# Patient Record
Sex: Female | Born: 1990 | Race: Black or African American | Hispanic: No | Marital: Single | Smoking: Former smoker
Health system: Southern US, Community
[De-identification: ages and names within clinical notes are randomized; demographics above are authoritative.]

---
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
## 2012-05-21 ENCOUNTER — Emergency Department (HOSPITAL_BASED_OUTPATIENT_CLINIC_OR_DEPARTMENT_OTHER)
Admission: EM | Admit: 2012-05-21 | Discharge: 2012-05-21 | Disposition: A | Discharge status: Home / Self Care | Payer: Self-pay | Attending: Emergency Medicine | Admitting: Emergency Medicine

## 2012-05-21 ENCOUNTER — Encounter (HOSPITAL_BASED_OUTPATIENT_CLINIC_OR_DEPARTMENT_OTHER): Payer: Self-pay | Admitting: *Deleted

## 2012-05-21 DIAGNOSIS — F17200 Nicotine dependence, unspecified, uncomplicated: Secondary | ICD-10-CM | POA: Insufficient documentation

## 2012-05-21 DIAGNOSIS — K219 Gastro-esophageal reflux disease without esophagitis: Secondary | ICD-10-CM

## 2012-05-21 DIAGNOSIS — R0789 Other chest pain: Secondary | ICD-10-CM | POA: Insufficient documentation

## 2012-05-21 MED ORDER — GI COCKTAIL ~~LOC~~
30.0000 mL | Freq: Once | ORAL | Status: AC
  Administered 2012-05-21: 30 mL via ORAL
  Filled 2012-05-21: qty 30

## 2012-05-21 MED ORDER — OMEPRAZOLE 20 MG PO CPDR: 20.0000 mg | DELAYED_RELEASE_CAPSULE | Freq: Every day | ORAL | Status: DC

## 2012-05-21 NOTE — Discharge Instructions (Signed)
Chest Pain (Nonspecific) Chest pain has many causes. Your pain could be caused by something serious, such as a heart attack or a blood clot in the lungs. It could also be caused by something less serious, such as a chest bruise or a virus. Follow up with your doctor. More lab tests or other studies may be needed to find the cause of your pain. Most of the time, nonspecific chest pain will improve within 2 to 3 days of rest and mild pain medicine. HOME CARE  For chest bruises, you may put ice on the sore area for 15 to 20 minutes, 3 to 4 times a day. Do this only if it makes you or your child feel better.   Put ice in a plastic bag.   Place a towel between the skin and the bag.   Rest for the next 2 to 3 days.   Go back to work if the pain improves.   See your doctor if the pain lasts longer than 1 to 2 weeks.   Only take medicine as told by your doctor.   Quit smoking if you smoke.  GET HELP RIGHT AWAY IF:   There is more pain or pain that spreads to the arm, neck, jaw, back, or belly (abdomen).   You or your child has shortness of breath.   You or your child coughs more than usual or coughs up blood.   You or your child has very bad back or belly pain, feels sick to his or her stomach (nauseous), or throws up (vomits).   You or your child has very bad weakness.   You or your child passes out (faints).   You or your child has a temperature by mouth above 102 F (38.9 C), not controlled by medicine.  Any of these problems may be serious and may be an emergency. Do not wait to see if the problems will go away. Get medical help right away. Call your local emergency services 911 in [place] Do not drive yourself to the hospital. MAKE SURE YOU:   Understand these instructions.   Will watch this condition.   Will get help right away if you or your child is not doing well or gets worse.  Document Released: 05/20/2008 Document Revised: 11/21/2011 Document Reviewed:  05/20/2008 [facility] Patient Information 2012 [place], [place].Gastroesophageal Reflux Disease, Adult Gastroesophageal reflux disease (GERD) happens when acid from your stomach goes into your food pipe (esophagus). The acid can cause a burning feeling in your chest. Over time, the acid can make small holes (ulcers) in your food pipe.  HOME CARE  Ask your doctor for advice about:   Losing weight.   Quitting smoking.   Alcohol use.   Avoid foods and drinks that make your problems worse. You may want to avoid:   Caffeine and alcohol.   Chocolate.   Mints.   Garlic and onions.   Spicy foods.   Citrus fruits, such as oranges, lemons, or limes.   Foods that contain tomato, such as sauce, chili, salsa, and pizza.   Fried and fatty foods.   Avoid lying down for 3 hours before you go to bed or before you take a nap.   Eat small meals often, instead of large meals.   Wear loose-fitting clothing. Do not wear anything tight around your waist.   Raise (elevate) the head of your bed 6 to 8 inches with wood blocks. Using extra pillows does not help.   Only take medicines as  told by your doctor.   Do not take aspirin or ibuprofen.  GET HELP RIGHT AWAY IF:   You have pain in your arms, neck, jaw, teeth, or back.   Your pain gets worse or changes.   You feel sick to your stomach (nauseous), throw up (vomit), or sweat (diaphoresis).   You feel short of breath, or you pass out (faint).   Your throw up is green, yellow, black, or looks like coffee grounds or blood.   Your poop (stool) is red, bloody, or black.  MAKE SURE YOU:   Understand these instructions.   Will watch your condition.   Will get help right away if you are not doing well or get worse.  Document Released: 05/20/2008 Document Revised: 11/21/2011 Document Reviewed: 06/21/2011 [facility] Patient Information 2012 [place], [place].

## 2012-05-21 NOTE — ED Notes (Signed)
Pt c/o upper gastric burning x 2 days

## 2012-05-21 NOTE — ED Provider Notes (Signed)
History     CSN: [id number]  Arrival date & time 05/21/12  1026   First MD Initiated Contact with Patient 05/21/12 1048      Chief Complaint  Patient presents with  . Chest Pain    (Consider location/radiation/quality/duration/timing/severity/associated sxs/prior treatment) Patient is a 21 y.o. female presenting with chest pain. The history is provided by the patient.  Chest Pain The chest pain began 2 days ago. Chest pain occurs intermittently. The chest pain is worsening. Associated with: lying flat. The severity of the pain is moderate. The quality of the pain is described as burning. The pain does not radiate. Chest pain is worsened by certain positions. Pertinent negatives for primary symptoms include no fever, no shortness of breath, no cough and no abdominal pain. She tried nothing for the symptoms. Risk factors include no known risk factors.     History reviewed. No pertinent past medical history.  History reviewed. No pertinent past surgical history.  History reviewed. No pertinent family history.  History  Substance Use Topics  . Smoking status: Current Some Day Smoker -- 0.5 packs/day  . Smokeless tobacco: Not on file  . Alcohol Use: No    OB History    Grav Para Term Preterm Abortions TAB SAB Ect Mult Living                  Review of Systems  Constitutional: Negative for fever.  Respiratory: Negative for cough and shortness of breath.   Cardiovascular: Positive for chest pain.  Gastrointestinal: Negative for abdominal pain.  All other systems reviewed and are negative.    Allergies  Review of patient's allergies indicates no known allergies.  Home Medications  No current outpatient prescriptions on file.  BP 149/90  Pulse 103  Temp(Src) 99.1 F (37.3 C) (Oral)  Resp 16  Ht 5\' 4"  (1.626 m)  Wt 138 lb (62.596 kg)  BMI 23.69 kg/m2  SpO2 100%  LMP 05/14/2012  Physical Exam  Nursing note and vitals reviewed. Constitutional: She is oriented to  person, place, and time. She appears well-developed and well-nourished. No distress.  HENT:  Head: Normocephalic and atraumatic.  Neck: Normal range of motion. Neck supple.  Cardiovascular: Normal rate and regular rhythm.  Exam reveals no gallop and no friction rub.   No murmur heard. Pulmonary/Chest: Effort normal and breath sounds normal. No respiratory distress. She has no wheezes.  Abdominal: Soft. Bowel sounds are normal. She exhibits no distension. There is no tenderness.  Musculoskeletal: Normal range of motion.  Neurological: She is alert and oriented to person, place, and time.  Skin: Skin is warm and dry. She is not diaphoretic.    ED Course  Procedures (including critical care time)  Labs Reviewed - No data to display No results found.   No diagnosis found.   Date: 05/21/2012  Rate: 79  Rhythm: normal sinus rhythm  QRS Axis: normal  Intervals: normal  ST/T Wave abnormalities: normal  Conduction Disutrbances:none  Narrative Interpretation:   Old EKG Reviewed: none available    MDM  The patient presents with symptoms that are most likely GI in nature.  I suspect gerd as the cause.  The symptoms are atypical for cardiac pain and the ekg looks fine.  She was given a gi cocktail which seems to have helped.  I will start her on prilosec twice daily and follow up as needed.  She was also counseled to stop smoking as this could be the cause of her symptoms as  well.          Geoffery Lyons, MD 05/21/12 [id number]

## 2012-08-03 ENCOUNTER — Encounter (HOSPITAL_BASED_OUTPATIENT_CLINIC_OR_DEPARTMENT_OTHER): Payer: Self-pay | Admitting: *Deleted

## 2012-08-03 ENCOUNTER — Emergency Department (HOSPITAL_BASED_OUTPATIENT_CLINIC_OR_DEPARTMENT_OTHER)
Admission: EM | Admit: 2012-08-03 | Discharge: 2012-08-03 | Disposition: A | Discharge status: Home / Self Care | Payer: Self-pay | Attending: Emergency Medicine | Admitting: Emergency Medicine

## 2012-08-03 DIAGNOSIS — N760 Acute vaginitis: Secondary | ICD-10-CM | POA: Insufficient documentation

## 2012-08-03 DIAGNOSIS — F17200 Nicotine dependence, unspecified, uncomplicated: Secondary | ICD-10-CM | POA: Insufficient documentation

## 2012-08-03 LAB — WET PREP, GENITAL: Trich, Wet Prep: NONE SEEN

## 2012-08-03 LAB — URINALYSIS, ROUTINE W REFLEX MICROSCOPIC
Glucose, UA: NEGATIVE mg/dL
Hgb urine dipstick: NEGATIVE
Ketones, ur: NEGATIVE mg/dL
Leukocytes, UA: NEGATIVE
Protein, ur: NEGATIVE mg/dL
pH: 7.5 (ref 5.0–8.0)

## 2012-08-03 MED ORDER — FLUCONAZOLE 50 MG PO TABS
150.0000 mg | ORAL_TABLET | Freq: Once | ORAL | Status: AC
  Administered 2012-08-03: 150 mg via ORAL
  Filled 2012-08-03: qty 1

## 2012-08-03 NOTE — ED Provider Notes (Addendum)
History  This chart was scribed for Gwyneth Sprout, MD by Bennett Scrape. This patient was seen in room MH10/MH10 and the patient's care was started at 3:26PM.  CSN: [id number]  Arrival date & time 08/03/12  1503   First MD Initiated Contact with Patient 08/03/12 1526      Chief Complaint  Patient presents with  . Abdominal Pain     The history is provided by the patient. No language interpreter was used.     Sandra French is a 21 y.o. female who presents to the Emergency Department complaining of one week of gradual onset, gradually worsening, constant lower abdominal pain that radiates into her mid back pain with associated nausea, frequency and thcik white vaginal discharge for 2 days. She reports that occasionally the pain is worse with sexual intercourse but she denies that eating makes the pain worse. She confirms prior episodes of similar symptoms attributed to UTIs. She states that her LNMP was 2 weeks ago but reports that she is on Depro and confirms that her periods are normally irregular. She denies fever, vaginal bleeding, dysuria, rash and emesis as associated symptoms. She denies having a recurrence of yeast infections. She does not have a h/o chronic medical conditions. She is a current everyday smoker but denies alcohol use.  History reviewed. No pertinent past medical history.  History reviewed. No pertinent past surgical history.  No family history on file.  History  Substance Use Topics  . Smoking status: Current Some Day Smoker -- 0.5 packs/day  . Smokeless tobacco: Not on file  . Alcohol Use: No    No OB history provided.  Review of Systems  Respiratory: Negative for cough and shortness of breath.   Gastrointestinal: Positive for nausea. Negative for vomiting and diarrhea.  Genitourinary: Positive for vaginal discharge and pelvic pain. Negative for dysuria, vaginal pain and menstrual problem.  All other systems reviewed and are negative.    A  complete 10 system review of systems was obtained and all systems are negative except as noted in the HPI and PMH.   Allergies  Review of patient's allergies indicates no known allergies.  Home Medications   Current Outpatient Rx  Name Route Sig Dispense Refill  . OMEPRAZOLE 20 MG PO CPDR Oral Take 1 capsule (20 mg total) by mouth daily. 30 capsule 1    Triage Vitals: BP 125/85  Pulse 69  Temp 98.2 F (36.8 C) (Oral)  Resp 18  Wt 148 lb (67.132 kg)  SpO2 100%  Physical Exam  Nursing note and vitals reviewed. Constitutional: She is oriented to person, place, and time. She appears well-developed and well-nourished. No distress.  HENT:  Head: Normocephalic and atraumatic.  Eyes: Conjunctivae and EOM are normal.  Neck: Neck supple. No tracheal deviation present.  Cardiovascular: Normal rate and regular rhythm.   No murmur heard. Pulmonary/Chest: Effort normal and breath sounds normal. No respiratory distress.  Abdominal: Soft. There is no tenderness. There is no rebound and no guarding.       No CVA tenderness  Genitourinary: Uterus normal. Cervix exhibits no motion tenderness, no discharge and no friability. Right adnexum displays no tenderness and no fullness. Left adnexum displays no tenderness and no fullness. No tenderness around the vagina. Vaginal discharge found.       Curd-like vaginal discharge  Musculoskeletal: Normal range of motion. She exhibits no edema.  Neurological: She is alert and oriented to person, place, and time.  Skin: Skin is warm and dry.  Psychiatric: She has a normal mood and affect. Her behavior is normal.    ED Course  Procedures (including critical care time)  DIAGNOSTIC STUDIES: Oxygen Saturation is 100% on room air, normal by my interpretation.    COORDINATION OF CARE: 3:31PM-Discussed treatment plan which includes a pelvic exam and urinalysis with pt at bedside and pt agreed to plan.   Labs Reviewed  URINALYSIS, ROUTINE W REFLEX  MICROSCOPIC - Abnormal; Notable for the following:    APPearance CLOUDY (*)     All other components within normal limits  WET PREP, GENITAL - Abnormal; Notable for the following:    Clue Cells Wet Prep HPF POC FEW (*)     WBC, Wet Prep HPF POC RARE (*)     All other components within normal limits  PREGNANCY, URINE  GC/CHLAMYDIA PROBE AMP, GENITAL   No results found.   1. Vaginitis       MDM   Patient complaining of lower abdominal pain and new vaginal discharge starting 3 days ago. Patient takes Depakote and denies any new sexual partners. Denies any dysuria, fever, vomiting, bowel changes. Low concern for urinary symptoms but feel most likely STD versus yeast infection.  Pelvic with signs of curd-like discharge which is consistent with yeast.  No sx of PID. UA and UPT are within normal limits. Wet prep and GC Chlamydia pending. I personally performed the services described in this documentation, which was scribed in my presence.  The recorded information has been reviewed and considered.  4:42 PM Wet prep unrevealing and will treat with yeast and wait for cultures.     Gwyneth Sprout, MD 08/03/12 1546  Gwyneth Sprout, MD 08/03/12 1550  Gwyneth Sprout, MD 08/03/12 1621  Gwyneth Sprout, MD 08/03/12 1642  Gwyneth Sprout, MD 08/03/12 1644

## 2012-08-03 NOTE — ED Notes (Signed)
Abdominal pain lower back pain x 1 week. Hx of UTI's in the past that give her the same feeling.

## 2012-08-04 LAB — GC/CHLAMYDIA PROBE AMP, GENITAL: Chlamydia, DNA Probe: NEGATIVE

## 2013-01-03 IMAGING — US US TRANSVAGINAL NON-OB
1 series · 14 of 25 positions shown · non-contrast
Comparison: None.

CLINICAL DATA: Pelvic pain, vaginal discharge.  Question TOA.

TRANSABDOMINAL AND TRANSVAGINAL ULTRASOUND OF PELVIS
TECHNIQUE: Both transabdominal and transvaginal ultrasound
examinations of the pelvis were performed including evaluation of
the uterus, ovaries, adnexal regions, and pelvic cul-de-sac.

[Series 1: us transvaginal non-ob · 0.22mm/px · 14 of 89 slices shown]
[im 1/89]
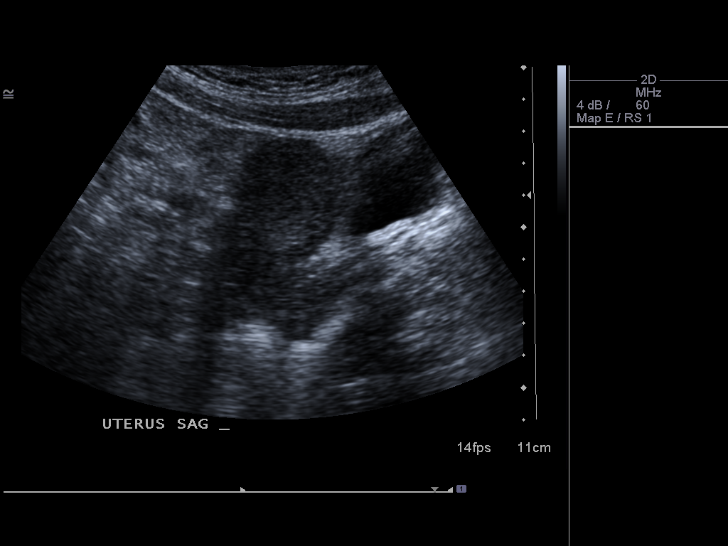
[im 8/89]
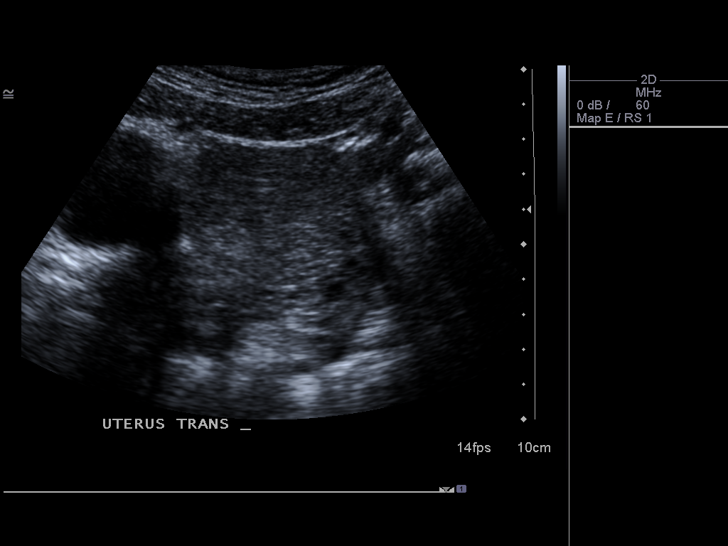
[im 15/89]
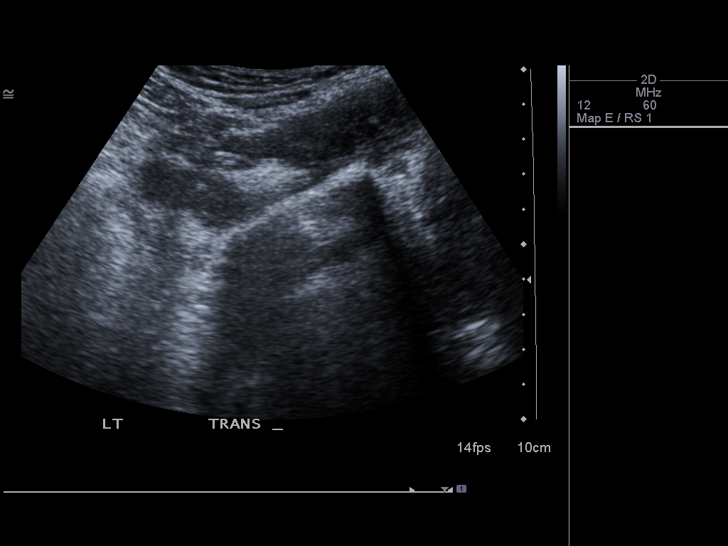
[im 23/89]
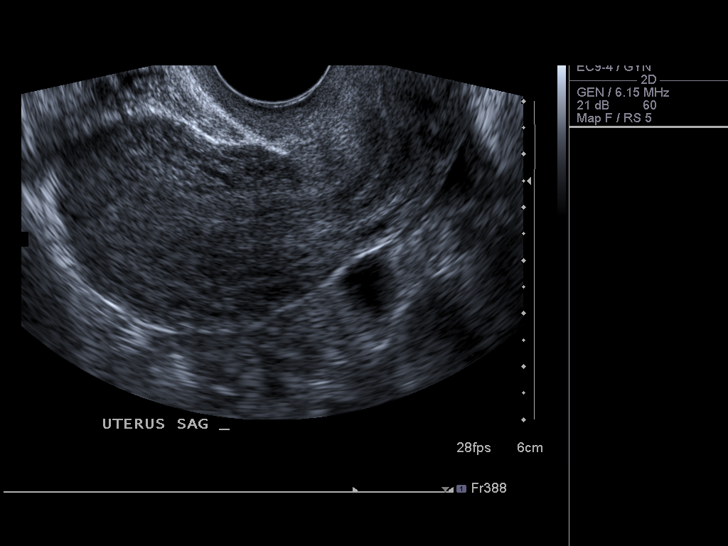
[im 30/89]
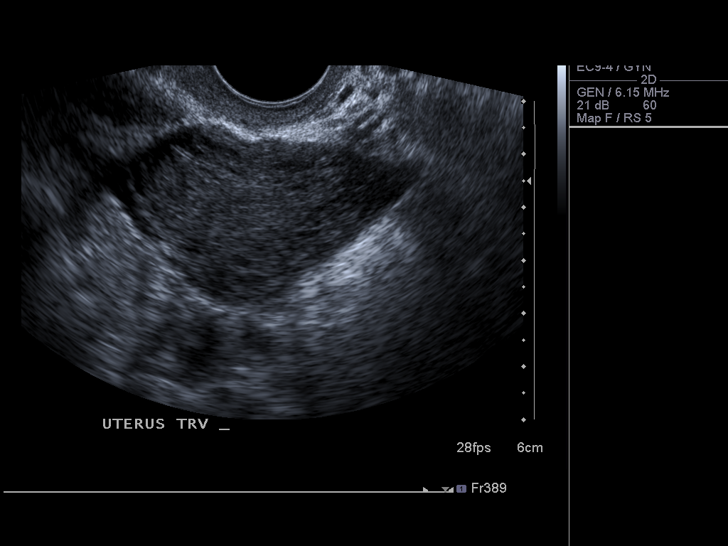
[im 34/89]
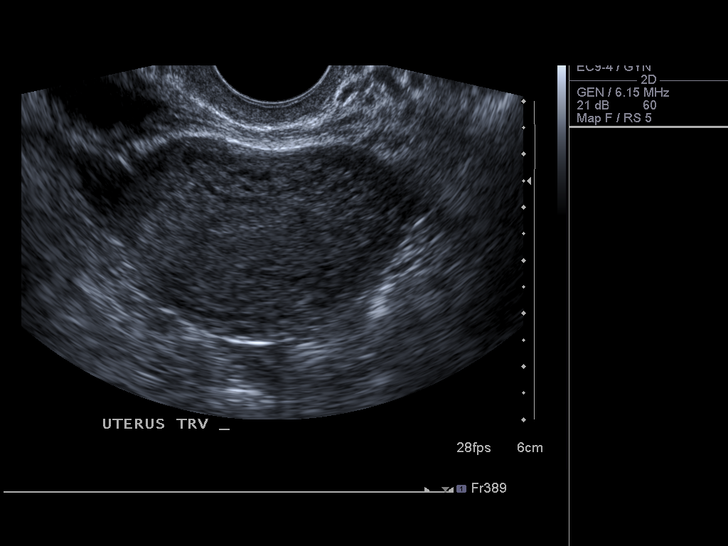
[im 41/89]
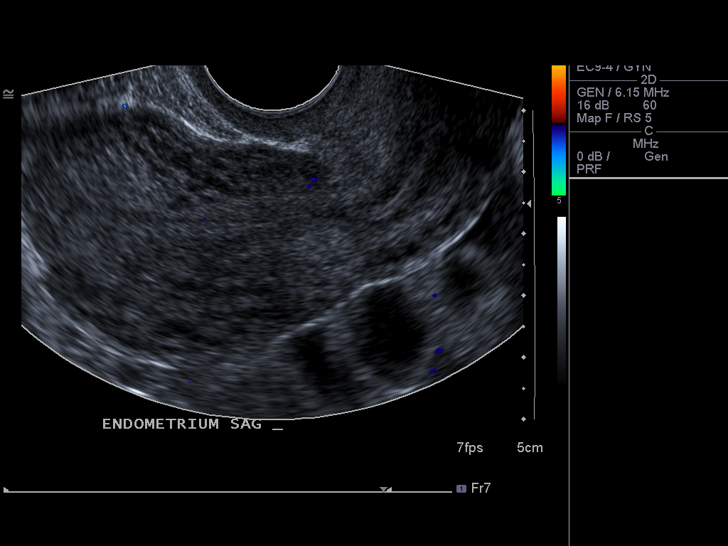
[im 48/89]
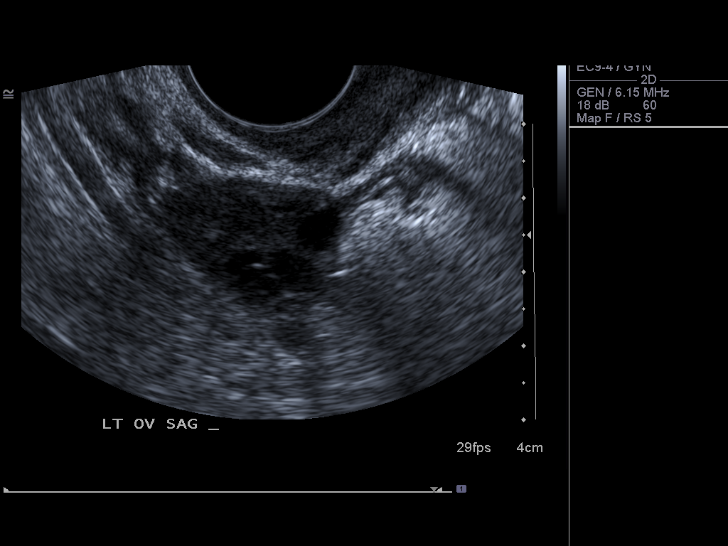
[im 56/89]
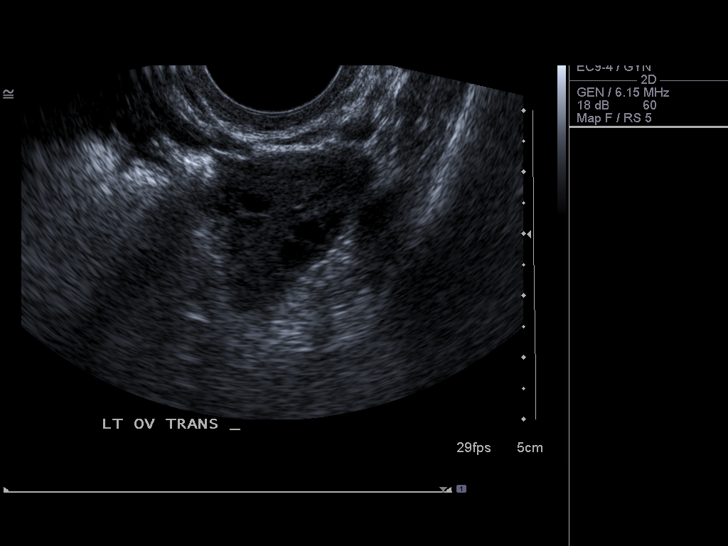
[im 59/89]
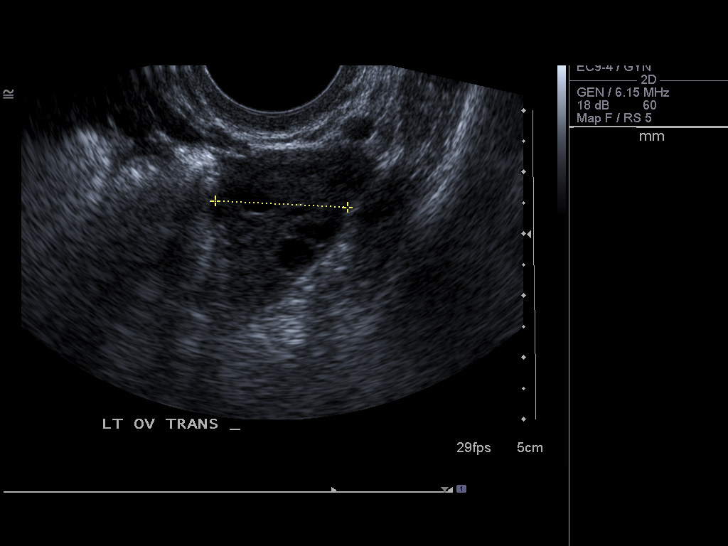
[im 67/89]
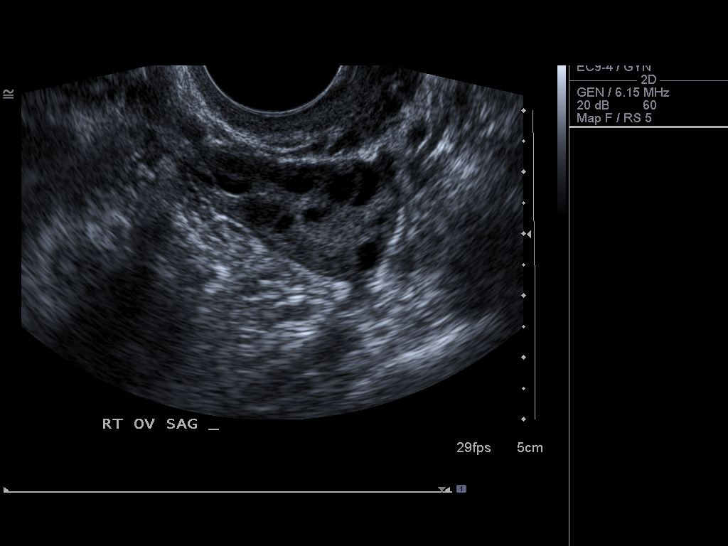
[im 74/89]
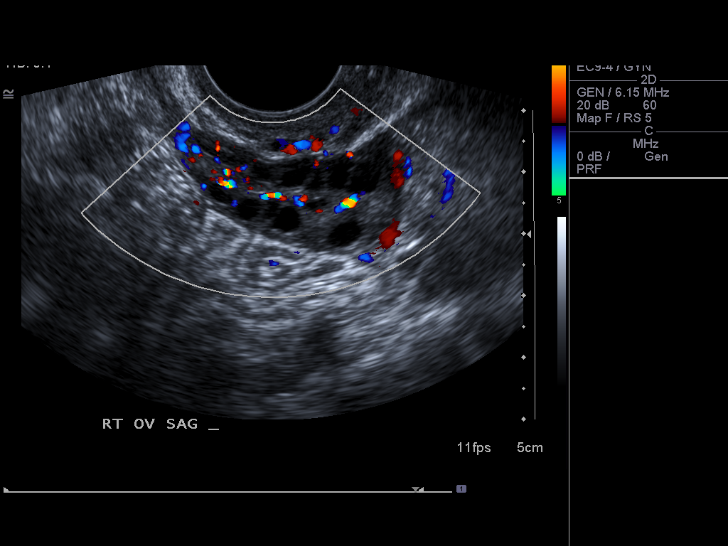
[im 81/89]
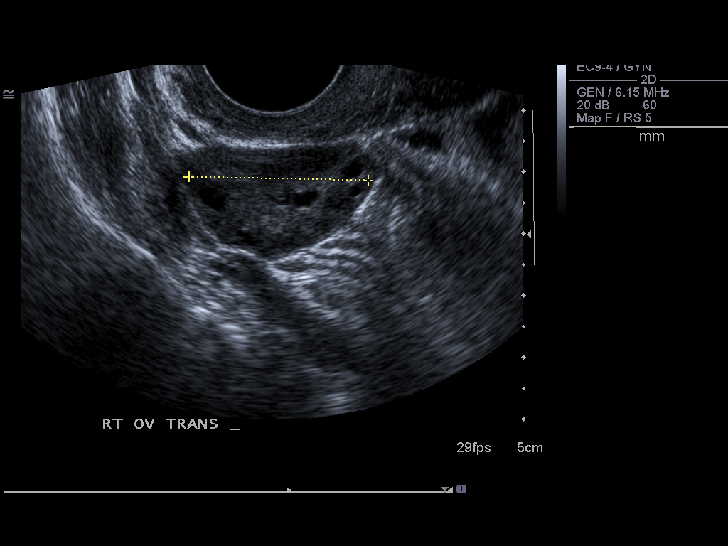
[im 89/89]
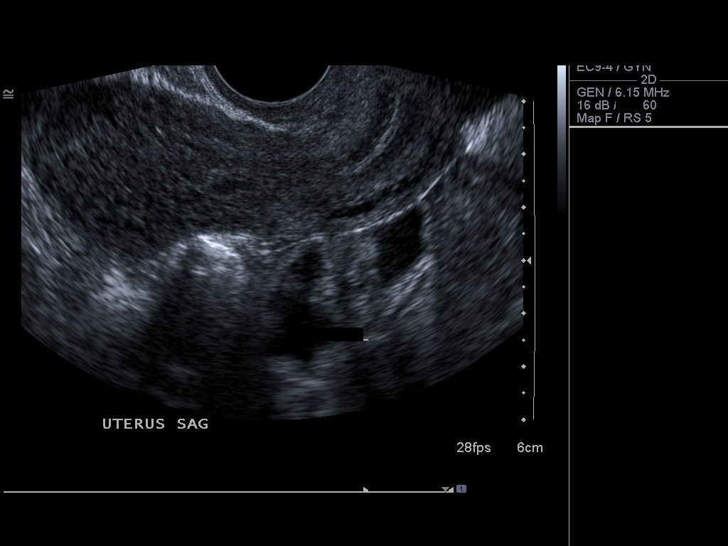

[14 of 25 positions shown; findings below may reference images not displayed]

FINDINGS: Uterus: 5.9 x 3.2 x 5.0 cm.  Normal echotexture.  No focal
abnormality.

Endometrium: Normal appearance and thickness, 3 mm.

Right Ovary: 3.4 x 1.5 x 2.9 cm.  Small follicles. Normal size and
echotexture.  No adnexal masses.

Left Ovary: 3.1 x 2.2 x 2.1 cm.  Small follicles. Normal size and
echotexture.  No adnexal masses.

Other Findings:  No free fluid.
IMPRESSION: Unremarkable pelvic ultrasound.

## 2013-04-02 ENCOUNTER — Encounter (HOSPITAL_BASED_OUTPATIENT_CLINIC_OR_DEPARTMENT_OTHER): Payer: Self-pay

## 2013-04-02 ENCOUNTER — Emergency Department (HOSPITAL_BASED_OUTPATIENT_CLINIC_OR_DEPARTMENT_OTHER): Payer: Medicaid Other

## 2013-04-02 ENCOUNTER — Emergency Department (HOSPITAL_BASED_OUTPATIENT_CLINIC_OR_DEPARTMENT_OTHER)
Admission: EM | Admit: 2013-04-02 | Discharge: 2013-04-02 | Disposition: A | Discharge status: Home / Self Care | Payer: Medicaid Other | Attending: Emergency Medicine | Admitting: Emergency Medicine

## 2013-04-02 DIAGNOSIS — O9989 Other specified diseases and conditions complicating pregnancy, childbirth and the puerperium: Secondary | ICD-10-CM | POA: Insufficient documentation

## 2013-04-02 DIAGNOSIS — Z87891 Personal history of nicotine dependence: Secondary | ICD-10-CM | POA: Insufficient documentation

## 2013-04-02 DIAGNOSIS — R110 Nausea: Secondary | ICD-10-CM | POA: Insufficient documentation

## 2013-04-02 DIAGNOSIS — Z3490 Encounter for supervision of normal pregnancy, unspecified, unspecified trimester: Secondary | ICD-10-CM

## 2013-04-02 DIAGNOSIS — R1084 Generalized abdominal pain: Secondary | ICD-10-CM | POA: Insufficient documentation

## 2013-04-02 LAB — URINALYSIS, ROUTINE W REFLEX MICROSCOPIC
Bilirubin Urine: NEGATIVE
Hgb urine dipstick: NEGATIVE
Ketones, ur: >80 mg/dL — AB
Nitrite: NEGATIVE
pH: 6.5 (ref 5.0–8.0)

## 2013-04-02 NOTE — ED Notes (Signed)
Pt reports she was recently treated for a yeast infection.

## 2013-04-02 NOTE — ED Provider Notes (Signed)
History     CSN: [id number]  Arrival date & time 04/02/13  1341   First MD Initiated Contact with Patient 04/02/13 1524      Chief Complaint  Patient presents with  . Abdominal Pain    (Consider location/radiation/quality/duration/timing/severity/associated sxs/prior treatment) Patient is a 22 y.o. female presenting with abdominal pain. The history is provided by the patient. No language interpreter was used.  Abdominal Pain Pain location:  Generalized Pain quality: aching   Pain severity:  Moderate Timing:  Constant Progression:  Worsening Chronicity:  New Relieved by:  Nothing Worsened by:  Nothing tried Ineffective treatments:  None tried Associated symptoms: nausea   Associated symptoms: no dysuria     History reviewed. No pertinent past medical history.  History reviewed. No pertinent past surgical history.  No family history on file.  History  Substance Use Topics  . Smoking status: Former Smoker -- 0.50 packs/day  . Smokeless tobacco: Not on file  . Alcohol Use: No    OB History   Grav Para Term Preterm Abortions TAB SAB Ect Mult Living   1               Review of Systems  Gastrointestinal: Positive for nausea and abdominal pain.  Genitourinary: Negative for dysuria.  All other systems reviewed and are negative.    Allergies  Review of patient's allergies indicates no known allergies.  Home Medications  No current outpatient prescriptions on file.  BP 103/63  Pulse 90  Temp(Src) 98.5 F (36.9 C) (Oral)  Resp 16  Ht 5\' 6"  (1.676 m)  Wt 153 lb (69.4 kg)  BMI 24.71 kg/m2  SpO2 98%  LMP 11/21/2012  Physical Exam  Nursing note and vitals reviewed. Constitutional: She is oriented to person, place, and time. She appears well-developed and well-nourished.  HENT:  Head: Normocephalic.  Right Ear: External ear normal.  Left Ear: External ear normal.  Nose: Nose normal.  Mouth/Throat: Oropharynx is clear and moist.  Eyes: Conjunctivae and  EOM are normal. Pupils are equal, round, and reactive to light.  Neck: Normal range of motion. Neck supple.  Cardiovascular: Normal rate and normal heart sounds.   Pulmonary/Chest: Effort normal and breath sounds normal.  Abdominal: Soft.  Musculoskeletal: Normal range of motion.  Neurological: She is alert and oriented to person, place, and time.  Skin: Skin is warm and dry.  Psychiatric: She has a normal mood and affect.    ED Course  Procedures (including critical care time)  Labs Reviewed  URINALYSIS, ROUTINE W REFLEX MICROSCOPIC - Abnormal; Notable for the following:    Ketones, ur >80 (*)    All other components within normal limits  PREGNANCY, URINE - Abnormal; Notable for the following:    Preg Test, Ur POSITIVE (*)    All other components within normal limits   US Ob Limited  04/02/2013  *RADIOLOGY REPORT*  Clinical Data: Hit in stomach.  Pelvic pain.  LIMITED OBSTETRIC ULTRASOUND  Number of Fetuses: 1 Heart Rate: 144 bpm Movement: Present Breathing: N/A Presentation: Cephalic Placental Location: Fundal, posterior Previa: Absent Amniotic Fluid (Subjective): Normal  Vertical Pocket 3.9 cm     AFI 13.5 cm  (5%ile = 8.3 cm; 95%ile = 19.4 cm)  BPD:  3.9 cm      17 w  6 d  MATERNAL FINDINGS: Cervix: Cervix closed. Uterus/Adnexae:  No abnormality visualized.  IMPRESSION: Approximately 17-day-18-week gestation with fetal heart rate 144 beats per minute.  No current complicating feature.  Recommend  followup with non-emergent complete OB 14+ wk US examination for fetal biometric evaluation and anatomic survey if not already performed.   Original Report Authenticated By: Charlett Nose, M.D.      1. Pregnancy       MDM   Pt counseled on results.  I advised her to follow up for prenatal care as scheduled       Elson Areas, PA-C 04/05/13 1223

## 2013-04-02 NOTE — ED Notes (Signed)
Pt reports abdominal pain that started this am.  States she is [redacted] weeks pregnant.

## 2013-04-06 NOTE — ED Provider Notes (Signed)
Medical screening examination/treatment/procedure(s) were performed by non-physician practitioner and as supervising physician I was immediately available for consultation/collaboration.   Vincie Linn III, MD 04/06/13 1114 

## 2014-06-14 ENCOUNTER — Encounter (HOSPITAL_BASED_OUTPATIENT_CLINIC_OR_DEPARTMENT_OTHER): Payer: Self-pay | Admitting: Emergency Medicine

## 2014-06-14 ENCOUNTER — Emergency Department (HOSPITAL_BASED_OUTPATIENT_CLINIC_OR_DEPARTMENT_OTHER)
Admission: EM | Admit: 2014-06-14 | Discharge: 2014-06-14 | Disposition: A | Discharge status: Home / Self Care | Payer: Medicaid Other | Attending: Emergency Medicine | Admitting: Emergency Medicine

## 2014-06-14 DIAGNOSIS — Z87891 Personal history of nicotine dependence: Secondary | ICD-10-CM | POA: Insufficient documentation

## 2014-06-14 DIAGNOSIS — K029 Dental caries, unspecified: Secondary | ICD-10-CM | POA: Insufficient documentation

## 2014-06-14 MED ORDER — PENICILLIN V POTASSIUM 250 MG PO TABS
ORAL_TABLET | ORAL | Status: AC
  Administered 2014-06-14: 500 mg
  Filled 2014-06-14: qty 2

## 2014-06-14 MED ORDER — PENICILLIN V POTASSIUM 500 MG PO TABS: 500.0000 mg | ORAL_TABLET | Freq: Four times a day (QID) | ORAL | Status: AC

## 2014-06-14 MED ORDER — HYDROCODONE-ACETAMINOPHEN 5-325 MG PO TABS: 1.0000 | ORAL_TABLET | Freq: Four times a day (QID) | ORAL | Status: DC | PRN

## 2014-06-14 MED ORDER — IBUPROFEN 800 MG PO TABS
ORAL_TABLET | ORAL | Status: AC
  Administered 2014-06-14: 800 mg
  Filled 2014-06-14: qty 1

## 2014-06-14 NOTE — ED Provider Notes (Signed)
CSN: [id number]     Arrival date & time 06/14/14  [id number] History   First MD Initiated Contact with Patient 06/14/14 [phone]     Chief Complaint  Patient presents with  . Abscess     (Consider location/radiation/quality/duration/timing/severity/associated sxs/prior Treatment) Patient is a 23 y.o. female presenting with tooth pain. The history is provided by the patient.  Dental Pain Location:  Lower Lower teeth location:  30/RL 1st molar and 31/RL 2nd molar Quality:  Dull Severity:  Severe Onset quality:  Gradual Timing:  Constant Progression:  Unchanged Chronicity:  New Context: dental fracture and filling fell out   Previous work-up:  Dental exam and filled cavity Relieved by:  Nothing Worsened by:  Nothing tried Ineffective treatments:  None tried Associated symptoms: neck swelling   Associated symptoms: no congestion, no facial swelling and no fever   Risk factors: smoking     History reviewed. No pertinent past medical history. History reviewed. No pertinent past surgical history. History reviewed. No pertinent family history. History  Substance Use Topics  . Smoking status: Former Smoker -- 0.50 packs/day  . Smokeless tobacco: Never Used  . Alcohol Use: No   OB History   Grav Para Term Preterm Abortions TAB SAB Ect Mult Living   1              Review of Systems  Constitutional: Negative for fever.  HENT: Negative for congestion and facial swelling.   All other systems reviewed and are negative.     Allergies  Review of patient's allergies indicates no known allergies.  Home Medications   Prior to Admission medications   Medication Sig Start Date End Date Taking? Authorizing Provider  HYDROcodone-acetaminophen (NORCO) 5-325 MG per tablet Take 1 tablet by mouth every 6 (six) hours as needed. 06/14/14   April K Palumbo-Rasch, MD  penicillin v potassium (VEETID) 500 MG tablet Take 1 tablet (500 mg total) by mouth 4 (four) times daily. 06/14/14 06/21/14  April K  Palumbo-Rasch, MD   BP 133/94  Pulse 80  Temp(Src) 98.8 F (37.1 C) (Oral)  Resp 20  Ht 5\' 6"  (1.676 m)  Wt 159 lb (72.122 kg)  BMI 25.68 kg/m2  SpO2 98%  Breastfeeding? Unknown Physical Exam  Constitutional: She is oriented to person, place, and time. She appears well-developed and well-nourished. No distress.  HENT:  Head: Normocephalic and atraumatic.  Mouth/Throat: Oropharynx is clear and moist. No trismus in the jaw. Dental caries present.    Eyes: Conjunctivae are normal. Pupils are equal, round, and reactive to light.  Neck: Normal range of motion. Neck supple.  Cardiovascular: Normal rate and regular rhythm.   Pulmonary/Chest: Effort normal and breath sounds normal. She has no wheezes. She has no rales.  Abdominal: Soft. Bowel sounds are normal. There is no tenderness. There is no rebound and no guarding.  Musculoskeletal: Normal range of motion.  Lymphadenopathy:    She has no cervical adenopathy.  Neurological: She is alert and oriented to person, place, and time.  Skin: Skin is warm and dry.  Psychiatric: She has a normal mood and affect.    ED Course  Procedures (including critical care time) Labs Review Labs Reviewed - No data to display  Imaging Review No results found.   EKG Interpretation None      MDM   Final diagnoses:  Dental caries    Medications  penicillin v potassium (VEETID) 250 MG tablet (500 mg  Given 06/14/14 0655)  ibuprofen (ADVIL,MOTRIN) 800 MG tablet (  800 mg  Given 06/14/14 [id number])   Will give RX for PCN and pain medication and have given [facility] info and resource guide.  Use barrier protection while taking antibiotics to prevent the risk of pregnancy.  Patient verbalizes understanding and agrees to follow up    April Smitty CordsK Palumbo-Rasch, MD 06/14/14 [phone]

## 2014-06-14 NOTE — ED Notes (Signed)
Pt reports right lower jaw pain r/t dental abscess

## 2014-06-14 NOTE — Discharge Instructions (Signed)
Dental Caries  Dental caries (also called tooth decay) is the most common oral disease. It can occur at any age, but is more common in children and young adults.  HOW DENTAL CARIES DEVELOPS  The process of decay begins when bacteria and foods (particularly sugars and starches) combine in your mouth to produce plaque. Plaque is a substance that sticks to the hard, outer surface of a tooth (enamel). The bacteria in plaque produce acids that attack enamel. These acids may also attack the root surface of a tooth (cementum) if it is exposed. Repeated attacks dissolve these surfaces and create holes in the tooth (cavities). If left untreated, the acids destroy the other layers of the tooth.  RISK FACTORS  Frequent sipping of sugary beverages.   Frequent snacking on sugary and starchy foods, especially those that easily get stuck in the teeth.   Poor oral hygiene.   Dry mouth.   Substance abuse such as methamphetamine abuse.   Broken or poor-fitting dental restorations.   Eating disorders.   Gastroesophageal reflux disease (GERD).   Certain radiation treatments to the head and neck. SYMPTOMS In the early stages of dental caries, symptoms are seldom present. Sometimes white, chalky areas may be seen on the enamel or other tooth layers. In later stages, symptoms may include:  Pits and holes on the enamel.  Toothache after sweet, hot, or cold foods or drinks are consumed.  Pain around the tooth.  Swelling around the tooth. DIAGNOSIS  Most of the time, dental caries is detected during a regular dental checkup. A diagnosis is made after a thorough medical and dental history is taken and the surfaces of your teeth are checked for signs of dental caries. Sometimes special instruments, such as lasers, are used to check for dental caries. Dental X-ray exams may be taken so that areas not visible to the eye (such as between the contact areas of the teeth) can be checked for cavities.    TREATMENT  If dental caries is in its early stages, it may be reversed with a fluoride treatment or an application of a remineralizing agent at the dental office. Thorough brushing and flossing at home is needed to aid these treatments. If it is in its later stages, treatment depends on the location and extent of tooth destruction:   If a small area of the tooth has been destroyed, the destroyed area will be removed and cavities will be filled with a material such as gold, silver amalgam, or composite resin.   If a large area of the tooth has been destroyed, the destroyed area will be removed and a cap (crown) will be fitted over the remaining tooth structure.   If the center part of the tooth (pulp) is affected, a procedure called a root canal will be needed before a filling or crown can be placed.   If most of the tooth has been destroyed, the tooth may need to be pulled (extracted). HOME CARE INSTRUCTIONS You can prevent, stop, or reverse dental caries at home by practicing good oral hygiene. Good oral hygiene includes:  Thoroughly cleaning your teeth at least twice a day with a toothbrush and dental floss.   Using a fluoride toothpaste. A fluoride mouth rinse may also be used if recommended by your dentist or health care provider.   Restricting the amount of sugary and starchy foods and sugary liquids you consume.   Avoiding frequent snacking on these foods and sipping of these liquids.   Keeping regular visits  with a dentist for checkups and cleanings. PREVENTION   Practice good oral hygiene.  Consider a dental sealant. A dental sealant is a coating material that is applied by your dentist to the pits and grooves of teeth. The sealant prevents food from being trapped in them. It may protect the teeth for several years.  Ask about fluoride supplements if you live in a community without fluorinated water or with water that has a low fluoride content. Use fluoride supplements  as directed by your dentist or health care provider.  Allow fluoride varnish applications to teeth if directed by your dentist or health care provider. Document Released: 08/24/2002 Document Revised: 08/04/2013 Document Reviewed: 12/04/2012 [facility] Patient Information 2015 [employer] [place]. This information is not intended to replace advice given to you by your health care provider. Make sure you discuss any questions you have with your health care provider.  Emergency Department Resource Guide 1) Find a Doctor and Pay Out of Pocket Although you won't have to find out who is covered by your insurance plan, it is a good idea to ask around and get recommendations. You will then need to call the office and see if the doctor you have chosen will accept you as a new patient and what types of options they offer for patients who are self-pay. Some doctors offer discounts or will set up payment plans for their patients who do not have insurance, but you will need to ask so you aren't surprised when you get to your appointment.  2) Contact Your Local Health Department Not all health departments have doctors that can see patients for sick visits, but many do, so it is worth a call to see if yours does. If you don't know where your local health department is, you can check in your phone book. The CDC also has a tool to help you locate your state's health department, and many state websites also have listings of all of their local health departments.  3) Find a Walk-in Clinic If your illness is not likely to be very severe or complicated, you may want to try a walk in clinic. These are popping up all over the country in pharmacies, drugstores, and shopping centers. They're usually staffed by nurse practitioners or physician assistants that have been trained to treat common illnesses and complaints. They're usually fairly quick and inexpensive. However, if you have serious medical issues or chronic medical  problems, these are probably not your best option.  No Primary Care Doctor: - Call Health Connect at  [phone] - they can help you locate a primary care doctor that  accepts your insurance, provides certain services, etc. - Physician Referral Service- [phone]  Chronic Pain Problems: Organization         Address  Phone   Notes  [facility]  [phone] Patients need to be referred by their primary care doctor.   Medication Assistance: Organization         Address  Phone   Notes  [facility] [address] [place], [place] [id number] [phone] --Must be a resident of [facility] -- Must have NO insurance coverage whatsoever (no Medicaid/ Medicare, etc.) -- The pt. MUST have a primary care doctor that directs their care regularly and follows them in the community   [facility]  [phone]   [employer]  [phone]    Agencies that provide inexpensive medical care: Organization  Address  Phone   Notes  °[facility]  ([phone]) [phone]   °[facility]    (336) 832-7272   °[facility] [address] °[place], [place] [address] (336) 832-4777   °[facility] [address], °[place] (336) 271-4999   °[facility]    (336) 373-0678   °[facility]    (336) 272-1050   °[facility] ° [address], [place] Phone:  (336) [phone], Fax:  (336) [phone] Hours of Operation:  9 am - 6 pm, M-F.  Also accepts Medicaid/Medicare and self-pay.  °[facility] ° [address], [place] Phone: (336) [phone], Fax: ([phone]) [phone]. Hours of Operation:  8:30 am - 5:30 pm, M-F.  Also accepts Medicaid and self-pay.  °[facility] [place] [address], [place] Phone: (336) [phone]   °[facility] [address], [place], [place] (336)723-1848, Ext. 123  Mondays & Thursdays: 7-9 AM.  First 15 patients are seen on a first come, first serve basis. °  ° °Medicaid-accepting [place] Providers: ° °Organization         Address  Phone   Notes  °[facility] [address], [place] ([phone]) [phone] Also accepts self-pay patients.  °[facility] [address], [place] ° (336) [phone]   °[facility] [address], [place] (336) 288-8857   °[facility] [address] [place] [address], [place] (336) 299-7000   °Veita Bland [address], [place]  ° (336) [phone] Only accepts [place] Access Medicaid patients after they have their name applied to their card.  ° °Self-Pay (no insurance) in [place]: ° °Organization         Address  Phone   Notes  °Sickle Cell Patients, [facility] [address], [place] (336) 832-1970   °[facility] [address], [place] (336) 832-4400   ° [facility] ° [address], [place] (336) 992-4800   °[facility]/Dr. Osei-Bonsu ° [address] [place] [address], [place] or [address], [place] (336) [phone] Phone number for both [place] and [place] locations is the same.  °[facility] [address], [place] (336) [phone]   °[facility] [address] [place] [address], [place] or [address] (336) 852-7530 °(336) 878-2260   °[facility] [address], [place] (336) 350-1642, phone; (336) [phone], fax Sees patients 1st and 3rd Saturday of every month.  Must not qualify for public or private insurance (i.e. Medicaid, Medicare, [place] Health Choice, Veterans' Benefits) • Household income should be no more than 200% of the poverty level •The clinic cannot treat you if you are pregnant or think you are pregnant • Sexually transmitted diseases are not treated at  the clinic.  ° ° °Dental Care: °Organization         Address  Phone  Notes  °[facility] [address], [place] (336) 641-6152 Accepts children up to age 21 who are enrolled in Medicaid or [place] Health Choice; pregnant women with a Medicaid card; and children who have applied for Medicaid or [place] Health Choice, but were declined, whose parents can pay a reduced fee at time   of service.  °[facility] [place]  [address], [place] (336) 641-7733 Accepts children up to age 21 who are enrolled in Medicaid or [place] Health Choice; pregnant women with a Medicaid card; and children who have applied for Medicaid or [place] Health Choice, but were declined, whose parents can pay a reduced fee at time of service.  °[facility] ° [address], [place] (336) 641-4533 Patients are seen by appointment only. Walk-ins are not accepted. [facility] will see patients 18 years of age and older. °Monday - Tuesday (8am-5pm) °Most Wednesdays (8:30-5pm) °$30 per visit, cash only  °[facility] ° [address], [place] (336) [phone] Patients are seen by appointment only. Walk-ins are not accepted. [facility] will see patients 18 years of age and older. °One Wednesday Evening (Monthly: Volunteer Based).  $30 per visit, cash only  °[facility]  ([phone]) [phone] for adults; Children under age 4, call Graduate Pediatric Dentistry at ([phone]) [phone]. Children aged 4-14, please call [phone]) [phone] to request a pediatric application. ° Dental services are provided in all areas of dental care including fillings, crowns and bridges, complete and partial dentures, implants, gum treatment, root canals, and extractions. Preventive care is also provided. Treatment is provided to both adults and children. °Patients are selected via a lottery and there is often a  waiting list. °  °[facility] [address], °[place] ° (336) 763-8833 [email or web address] °  °[facility] [address], [place], [place] (336)723-1848, Ext. 123 Second and Fourth Thursday of each month, opens at 6:30 AM; Clinic ends at 9 AM.  Patients are seen on a first-come first-served basis, and a limited number are seen during each clinic.  ° °[facility] ° [address], [place], [place] (336) 723-7904   Eligibility Requirements °You must have lived in [place], [place], or [place] counties for at least the last three months. °  You cannot be eligible for state or federal sponsored healthcare insurance, including Veterans Administration, Medicaid, or Medicare. °  You generally cannot be eligible for healthcare insurance through your employer.  °  How to apply: °Eligibility screenings are held every Tuesday and Wednesday afternoon from 1:00 pm until 4:00 pm. You do not need an appointment for the interview!  °[facility] [address], [place], [place] 336-631-2330   °[facility]  336-342-8273   °[facility]  336-703-3100   °[facility]  336-570-6415   ° °Behavioral Health Resources in the Community: °Intensive Outpatient Programs °Organization         Address  Phone  Notes  °[facility] [address], [place], [place] 336-878-6098   °[facility] [address], [place], [place] 336-832-9800   °[facility] [address], [place], [place] ° 336-882-2125   °[facility] [address],  °[place], [place] 1-800-853-5163 or 336-641-4981   °Substance Abuse Resources °Organization         Address  Phone  Notes  °[facility]  336-882-2125   °[facility]  336-784-9470   °[facility]  336-285-9073   °[facility]  336-845-3988   °Residential & Outpatient Substance Abuse  Program  1-800-659-3381   °Psychological Services °Organization         Address    Phone  Notes  °[facility]  336- 832-9600   °[facility]  336- 378-7881   °[facility] [address], [place] 1-800-853-5163 or 336-641-4981   ° °Mobile Crisis Teams °Organization         Address  Phone  Notes  °[facility], Mobile Crisis Care Unit  1-877-626-1772   °Assertive °[facility] ° [address] [place], [place] 336-834-9664   °Sharon DeEsch [address] °[place] 336-554-5454   ° °Self-Help/Support Groups °Organization         Address  Phone             Notes  °[facility] - variety of support groups  336- 373-1402 Call for more information  °Narcotics Anonymous (NA), [facility] [address], °[place]  2 meetings at this location  ° °Residential Treatment Programs °Organization         Address  Phone  Notes  °[facility] [address],    °[place]  1-866-801-8205   °[facility] ° [address], [place], [place] 704-293-8524   °[facility] [address], [place] 336-845-3988 Admissions: 8am-3pm M-F  °[facility] [address]    °[place], [place] 336-841-1104   °[facility] [address], [place], [place] 336-379-7146   °[facility] [address]  °[place], [place] 336-285-9073   °[facility] - Intensive Outpatient [address], [place], [place] 336-852-3033   °[facility] [address]  °[place], [place] 1-877-615-2722 or 336-784-9470   °[facility] [address] [place], [place] 336-227-7417 Accepts Medicaid  °[facility] [address]  ° [place] 1-800-659-3381 Substance Abuse/Addiction Treatment  ° °[place] Behavioral Health Resources °Organization          Address  Phone  Notes  °[facility]  (888) 581-9988   °Julie Brannon, PhD [address] [place], [place]   (336) 349-5553 or (336) 951-0000   °[facility]   [address] °[place], [place] (336) 349-4454   °[facility] [address], [place], [place] (336) 342-8316 Insurance/Medicaid/sponsorship through [facility]  °[facility] [address]                                    [place], [place] (336) 342-8316 Therapy/tele-psych/case  °[facility] [address]  ° [place], [place] (336) 349-2233    °Dr. Arfeen  (336) 349-4544   °[facility] [place]  [employer] [place] [facility] 1) [address], [place] °2) [address], [place] °3)  [address], [place] (336) 349-3220 °(336) 342-7768 ° °(336) 342-8140   °[place] [facility] (336) 342-1394 or (336) 342-3537 (After Hours)    ° ° °

## 2014-07-04 ENCOUNTER — Emergency Department (HOSPITAL_BASED_OUTPATIENT_CLINIC_OR_DEPARTMENT_OTHER): Payer: Medicaid Other

## 2014-07-04 ENCOUNTER — Encounter (HOSPITAL_BASED_OUTPATIENT_CLINIC_OR_DEPARTMENT_OTHER): Payer: Self-pay | Admitting: Emergency Medicine

## 2014-07-04 ENCOUNTER — Emergency Department (HOSPITAL_BASED_OUTPATIENT_CLINIC_OR_DEPARTMENT_OTHER)
Admission: EM | Admit: 2014-07-04 | Discharge: 2014-07-04 | Disposition: A | Discharge status: Home / Self Care | Payer: Self-pay | Attending: Emergency Medicine | Admitting: Emergency Medicine

## 2014-07-04 DIAGNOSIS — Z79899 Other long term (current) drug therapy: Secondary | ICD-10-CM | POA: Insufficient documentation

## 2014-07-04 DIAGNOSIS — Z87891 Personal history of nicotine dependence: Secondary | ICD-10-CM | POA: Insufficient documentation

## 2014-07-04 DIAGNOSIS — N925 Other specified irregular menstruation: Secondary | ICD-10-CM | POA: Insufficient documentation

## 2014-07-04 DIAGNOSIS — N949 Unspecified condition associated with female genital organs and menstrual cycle: Secondary | ICD-10-CM | POA: Insufficient documentation

## 2014-07-04 DIAGNOSIS — N938 Other specified abnormal uterine and vaginal bleeding: Secondary | ICD-10-CM | POA: Insufficient documentation

## 2014-07-04 DIAGNOSIS — Z3202 Encounter for pregnancy test, result negative: Secondary | ICD-10-CM | POA: Insufficient documentation

## 2014-07-04 DIAGNOSIS — R109 Unspecified abdominal pain: Secondary | ICD-10-CM | POA: Insufficient documentation

## 2014-07-04 DIAGNOSIS — R102 Pelvic and perineal pain: Secondary | ICD-10-CM

## 2014-07-04 DIAGNOSIS — K0889 Other specified disorders of teeth and supporting structures: Secondary | ICD-10-CM

## 2014-07-04 DIAGNOSIS — R1031 Right lower quadrant pain: Secondary | ICD-10-CM | POA: Insufficient documentation

## 2014-07-04 DIAGNOSIS — N939 Abnormal uterine and vaginal bleeding, unspecified: Secondary | ICD-10-CM

## 2014-07-04 DIAGNOSIS — K089 Disorder of teeth and supporting structures, unspecified: Secondary | ICD-10-CM | POA: Insufficient documentation

## 2014-07-04 DIAGNOSIS — Z880 Allergy status to penicillin: Secondary | ICD-10-CM | POA: Insufficient documentation

## 2014-07-04 LAB — URINE MICROSCOPIC-ADD ON

## 2014-07-04 LAB — BASIC METABOLIC PANEL
ANION GAP: 10 (ref 5–15)
BUN: 12 mg/dL (ref 6–23)
CALCIUM: 9.1 mg/dL (ref 8.4–10.5)
CO2: 26 mEq/L (ref 19–32)
Chloride: 107 mEq/L (ref 96–112)
Creatinine, Ser: 0.9 mg/dL (ref 0.50–1.10)
GFR calc Af Amer: >90 mL/min (ref >90)
GLUCOSE: 98 mg/dL (ref 70–99)
Potassium: 3.9 mEq/L (ref 3.7–5.3)
SODIUM: 143 meq/L (ref 137–147)

## 2014-07-04 LAB — CBC WITH DIFFERENTIAL/PLATELET
BASOS ABS: 0 10*3/uL (ref 0.0–0.1)
BASOS PCT: 0 % (ref 0–1)
EOS ABS: 0.2 10*3/uL (ref 0.0–0.7)
EOS PCT: 2 % (ref 0–5)
HCT: 35.8 % — ABNORMAL LOW (ref 36.0–46.0)
Hemoglobin: 12.1 g/dL (ref 12.0–15.0)
Lymphocytes Relative: 39 % (ref 12–46)
Lymphs Abs: 2.8 10*3/uL (ref 0.7–4.0)
MCH: 29.8 pg (ref 26.0–34.0)
MCHC: 33.8 g/dL (ref 30.0–36.0)
MCV: 88.2 fL (ref 78.0–100.0)
Monocytes Absolute: 0.5 10*3/uL (ref 0.1–1.0)
Monocytes Relative: 7 % (ref 3–12)
Neutro Abs: 3.7 10*3/uL (ref 1.7–7.7)
Neutrophils Relative %: 52 % (ref 43–77)
PLATELETS: 199 10*3/uL (ref 150–400)
RBC: 4.06 MIL/uL (ref 3.87–5.11)
RDW: 13.4 % (ref 11.5–15.5)
WBC: 7.2 10*3/uL (ref 4.0–10.5)

## 2014-07-04 LAB — URINALYSIS, ROUTINE W REFLEX MICROSCOPIC
BILIRUBIN URINE: NEGATIVE
Glucose, UA: NEGATIVE mg/dL
KETONES UR: 15 mg/dL — AB
NITRITE: NEGATIVE
Protein, ur: NEGATIVE mg/dL
SPECIFIC GRAVITY, URINE: 1.029 (ref 1.005–1.030)
UROBILINOGEN UA: 1 mg/dL (ref 0.0–1.0)
pH: 6.5 (ref 5.0–8.0)

## 2014-07-04 LAB — WET PREP, GENITAL
Clue Cells Wet Prep HPF POC: NONE SEEN
Trich, Wet Prep: NONE SEEN
YEAST WET PREP: NONE SEEN

## 2014-07-04 LAB — PREGNANCY, URINE: Preg Test, Ur: NEGATIVE

## 2014-07-04 MED ORDER — ONDANSETRON HCL 4 MG/2ML IJ SOLN
4.0000 mg | Freq: Once | INTRAMUSCULAR | Status: AC
  Administered 2014-07-04: 4 mg via INTRAVENOUS
  Filled 2014-07-04: qty 2

## 2014-07-04 MED ORDER — MORPHINE SULFATE 4 MG/ML IJ SOLN
4.0000 mg | Freq: Once | INTRAMUSCULAR | Status: AC
  Administered 2014-07-04: 4 mg via INTRAVENOUS
  Filled 2014-07-04: qty 1

## 2014-07-04 MED ORDER — SODIUM CHLORIDE 0.9 % IV SOLN
Freq: Once | INTRAVENOUS | Status: AC
Start: 2014-07-04 — End: 2014-07-04
  Administered 2014-07-04: 20:00:00 via INTRAVENOUS

## 2014-07-04 MED ORDER — HYDROCODONE-ACETAMINOPHEN 5-325 MG PO TABS: 1.0000 | ORAL_TABLET | ORAL | Status: DC | PRN

## 2014-07-04 NOTE — ED Provider Notes (Signed)
Medical screening examination/treatment/procedure(s) were performed by non-physician practitioner and as supervising physician I was immediately available for consultation/collaboration.   EKG Interpretation None       Martha K Linker, MD 07/04/14 2256 

## 2014-07-04 NOTE — Discharge Instructions (Signed)
Read the information below.  Use the prescribed medication as directed.  Please discuss all new medications with your pharmacist.  Do not take additional tylenol while taking the prescribed pain medication to avoid overdose.  You may return to the Emergency Department at any time for worsening condition or any new symptoms that concern you.    If you develop high fevers, worsening abdominal pain, uncontrolled vomiting, or are unable to tolerate fluids by mouth, return to the ER for a recheck.    Please call the oral surgeon listed above to schedule a close follow up appointment.  If you develop fevers, swelling in your face, difficulty swallowing or breathing, return to the ER immediately for a recheck.     Abdominal Pain Many things can cause abdominal pain. Usually, abdominal pain is not caused by a disease and will improve without treatment. It can often be observed and treated at home. Your health care provider will do a physical exam and possibly order blood tests and X-rays to help determine the seriousness of your pain. However, in many cases, more time must pass before a clear cause of the pain can be found. Before that point, your health care provider may not know if you need more testing or further treatment. HOME CARE INSTRUCTIONS  Monitor your abdominal pain for any changes. The following actions may help to alleviate any discomfort you are experiencing:  Only take over-the-counter or prescription medicines as directed by your health care provider.  Do not take laxatives unless directed to do so by your health care provider.  Try a clear liquid diet (broth, tea, or water) as directed by your health care provider. Slowly move to a bland diet as tolerated. SEEK MEDICAL CARE IF:  You have unexplained abdominal pain.  You have abdominal pain associated with nausea or diarrhea.  You have pain when you urinate or have a bowel movement.  You experience abdominal pain that wakes you in  the night.  You have abdominal pain that is worsened or improved by eating food.  You have abdominal pain that is worsened with eating fatty foods.  You have a fever. SEEK IMMEDIATE MEDICAL CARE IF:   Your pain does not go away within 2 hours.  You keep throwing up (vomiting).  Your pain is felt only in portions of the abdomen, such as the right side or the left lower portion of the abdomen.  You pass bloody or black tarry stools. MAKE SURE YOU:  Understand these instructions.   Will watch your condition.   Will get help right away if you are not doing well or get worse.  Document Released: 09/11/2005 Document Revised: 12/07/2013 Document Reviewed: 08/11/2013 [facility] Patient Information 2015 [place], [place]. This information is not intended to replace advice given to you by your health care provider. Make sure you discuss any questions you have with your health care provider.  Abnormal Uterine Bleeding Abnormal uterine bleeding can affect women at various stages in life, including teenagers, women in their reproductive years, pregnant women, and women who have reached menopause. Several kinds of uterine bleeding are considered abnormal, including:  Bleeding or spotting between periods.   Bleeding after sexual intercourse.   Bleeding that is heavier or more than normal.   Periods that last longer than usual.  Bleeding after menopause.  Many cases of abnormal uterine bleeding are minor and simple to treat, while others are more serious. Any type of abnormal bleeding should be evaluated by your health care provider.  Treatment will depend on the cause of the bleeding. HOME CARE INSTRUCTIONS Monitor your condition for any changes. The following actions may help to alleviate any discomfort you are experiencing:  Avoid the use of tampons and douches as directed by your health care provider.  Change your pads frequently. You should get regular pelvic exams and Pap tests.  Keep all follow-up appointments for diagnostic tests as directed by your health care provider.  SEEK MEDICAL CARE IF:   Your bleeding lasts more than 1 week.   You feel dizzy at times.  SEEK IMMEDIATE MEDICAL CARE IF:   You pass out.   You are changing pads every 15 to 30 minutes.   You have abdominal pain.  You have a fever.   You become sweaty or weak.   You are passing large blood clots from the vagina.   You start to feel nauseous and vomit. MAKE SURE YOU:   Understand these instructions.  Will watch your condition.  Will get help right away if you are not doing well or get worse. Document Released: 12/02/2005 Document Revised: 12/07/2013 Document Reviewed: 07/01/2013 [facility] Patient Information 2015 [place], [place]. This information is not intended to replace advice given to you by your health care provider. Make sure you discuss any questions you have with your health care provider.   Emergency Department Resource Guide 1) Find a Doctor and Pay Out of Pocket Although you won't have to find out who is covered by your insurance plan, it is a good idea to ask around and get recommendations. You will then need to call the office and see if the doctor you have chosen will accept you as a new patient and what types of options they offer for patients who are self-pay. Some doctors offer discounts or will set up payment plans for their patients who do not have insurance, but you will need to ask so you aren't surprised when you get to your appointment.  2) Contact Your Local Health Department Not all health departments have doctors that can see patients for sick visits, but many do, so it is worth a call to see if yours does. If you don't know where your local health department is, you can check in your phone book. The CDC also has a tool to help you locate your state's health department, and many state websites also have listings of all of their local health  departments.  3) Find a Walk-in Clinic If your illness is not likely to be very severe or complicated, you may want to try a walk in clinic. These are popping up all over the country in pharmacies, drugstores, and shopping centers. They're usually staffed by nurse practitioners or physician assistants that have been trained to treat common illnesses and complaints. They're usually fairly quick and inexpensive. However, if you have serious medical issues or chronic medical problems, these are probably not your best option.  No Primary Care Doctor: - Call Health Connect at  [phone] - they can help you locate a primary care doctor that  accepts your insurance, provides certain services, etc. - Physician Referral Service- [phone]  Chronic Pain Problems: Organization         Address  Phone   Notes  [facility]  [phone] Patients need to be referred by their primary care doctor.   Medication Assistance: Organization         Address  Phone   Notes  [facility] [phone]  [address] [place], [place] [address] [phone] --Must be a resident of [facility] -- Must have NO insurance coverage whatsoever (no Medicaid/ Medicare, etc.) -- The pt. MUST have a primary care doctor that directs their care regularly and follows them in the community   [facility]ist  [phone]   [employer]  [phone]    Agencies that provide inexpensive medical care: Organization         Address  Phone   Notes  [facility]  [phone]   [facility]    [phone]   [facility] [address] [place], [place] [address] [phone]   [facility] [address] [place] [phone]   Planned Parenthood    [phone]   [facility]    [phone]   [facility]  [address], [place] Phone:  [phone], Fax:  [phone] Hours of Operation:  9 am - 6 pm, M-F.  Also accepts Medicaid/Medicare and self-pay.  [facility]  [address], [place] Phone: [phone], Fax: [phone]. Hours of Operation:  8:30 am - 5:30 pm, M-F.  Also accepts Medicaid and self-pay.  [facility] [place] [address], [place] Phone: [phone]   [facility] [address] Natasha Bence [place], [place] [phone], Ext. [phone] Mondays & Thursdays: 7-9 AM.  First 15 patients are seen on a first come, first serve basis.    Medicaid-accepting [facility] Providers:  Organization         Address  Phone   Notes  [facility] [address], [place] [phone] Also accepts self-pay patients.  [facility] [address] Laurell Josephs [place], [place]  [phone]   [facility] [address], [place] [phone]   [facility] [address] [place] [phone]   Renaye Rakers [address], [place]   [phone] Only accepts [place] patients after they have their name applied to their card.   Self-Pay (no insurance) in [facility]:  Organization         Address  Phone   Notes  Sickle Cell Patients, [facility] [address] [place], [place] [phone]   [facility] [address] [place], [place] [phone]   [facility] [place]  [address], [place] [phone]   [facility]/Dr. Osei-Bonsu  [address] [place] or [address], [place] [phone] Phone number for both [place] and [place] locations is the same.  [facility] [address] [place] [phone]   [facility] [address] [place] or [address] [phone]   [facility] [address], [place] [phone], phone; [phone], fax Sees patients 1st and 3rd Saturday of every month.  Must not qualify for public or private insurance (i.e. Medicaid, Medicare, [place] Health Choice, Veterans' Benefits)  Household income should be no more than 200% of the poverty level The clinic cannot treat you if you are pregnant or think you are pregnant  Sexually transmitted diseases are not treated at the clinic.  Dental Care: Organization         Address  Phone  Notes  [facility] [address], [place] [phone]) [phone] Accepts children up to age 21 who are enrolled in [place]icaid or [place] Health Choice; pregnant women with a Medicaid card; and children who have applied for Medicaid or [place] Health Choice, but were declined, whose parents can pay a reduced fee at time of service.  [facility] [place]  [address], [place] [phone]) [phone] Accepts children up to age 21 who are enrolled in [place] or [place] Health Choice; pregnant women with a Medicaid card; and children who have applied for Medicaid or [place] Health Choice, but were declined, whose parents can pay a reduced fee at time of service.  [facility]  [address], [place] [phone] Patients are seen by appointment only. Walk-ins are not accepted. [facility] will see patients 21 years of age and older. Monday - Tuesday (8am-5pm) Most Wednesdays (8:30-5pm) $30 per visit, cash only  [facility]  [address], [facility] [phone] Patients are seen by appointment only. Walk-ins are not accepted. [facility] will see patients 21 years of age and older. One Wednesday Evening (Monthly: Volunteer Based).  $30 per visit, cash only  [employer] [facility]  [phone] for adults;  Children under age 644, call Graduate Pediatric Dentistry at [phone]. Children aged 744-14, please call [phone] to request a pediatric application.  Dental services are provided in all areas of dental care including fillings, crowns and bridges, complete and partial dentures, implants, gum treatment, root canals, and extractions. Preventive care is also provided. Treatment is provided to both adults and children. Patients are selected via a lottery and there is often a waiting list.   [facility] [address], [place]  [phone]) [phone] [email or web address]   [facility] [address], [place], [place] [phone], Ext. [phone] Second and Fourth Thursday of each month, opens at 6:30 AM; Clinic ends at 9 AM.  Patients are seen on a first-come first-served basis, and a limited number are seen during each clinic.   [facility]  [address], [place], [place] [phone]) [phone]   Eligibility Requirements You must have lived in [place], [place], or [place] counties for at least the last three months.   You cannot be eligible for state or federal sponsored [place]healthcare insurance, including CIGNAVeterans Administration, [place]icaid, or [employer]icare.   You generally cannot be eligible for healthcare insurance through your employer.    How to apply: Eligibility screenings are held every Tuesday and Wednesday afternoon from 1:00 pm until 4:00 pm. You do not need an appointment for the interview!  [facility] [address], [place], [place] [phone]   [facility]  [phone]   [facility]  [phone]   [facility]  [phone]    Behavioral Health Resources in the Community: Intensive Outpatient Programs Organization         Address  Phone  Notes  [facility] [address], [place], [place] [phone]   [facility] [address], [place], [place] [phone]   [facility] [address], [place], [place]  [phone]   [facility] [address],  [place], [place] [phone] or [phone]   Substance Abuse Resources Organization         Address  Phone  Notes  [facility]  [phone]   [facility]  [phone]   [facility]  [phone]   [facility]  [phone]   Residential & Outpatient Substance Abuse Program  [phone]   Psychological Services Organization         Address  Phone  Notes  [facility]  [phone]- [phone]   [facility]  [phone]- [phone]   [facility] [address], [place] [phone] or [phone]    Mobile Crisis Teams Organization         Address  Phone  Notes  [facility], Mobile Crisis Care Unit  [phone]   [facility]  [address] [place], [place] [phone]   Doristine LocksSharon DeEsch [address] [place] [phone]    Self-Help/Support Groups Organization         Address  Phone             Notes  [facility] [place] - variety of support groups  [phone] Call for more information  Narcotics Anonymous (NA), [facility]aring [facility] [address], [place]  2 meetings at this location   Statisticianesidential Treatment Programs Organization         Address  Phone  Notes  [facility] [address],    [place]  [phone]   [facility]  [address], [place] [phone], [place], [place] [phone]   [facility] [address], [place] [phone] Admissions: 8am-3pm M-F  [facility] [address]    [place], [place] [phone]   [facility] [address], [place], [place] [phone]   [facility] [address]  [place], [place] [phone]   [facility] - Intensive  Outpatient [address], [place], [place] [phone]   [facility] ([facility] [address]  [place], [place] [phone] or [phone]   [facility] (RT[facility]) [address] [place], [place] [phone] Accepts Medicaid  [facility] [address]  [place] [phone] Substance Abuse/Addiction Treatment   [facility] Resources Organization         Address  Phone  Notes  [facility]  [phone]) [phone]   Angie FavaJulie Brannon, PhD [address], [address] [place], [place]   [phone] or [phone]   [facility] [place]   [address] [place], [place] [phone](336) [phone]   [facility] [address], [place], [place] [phone] Insurance/Medicaid/sponsorship through [facility]  [facility] [address]                                    [place], [place] [phone] Therapy/tele-psych/case  [facility] [address]   [place], [place] [phone]) [phone]    Dr. Lolly MustacheArfeen  [phone]   [facility] [place]  [employer] [facility] 1) [address], [place] 2) [address], [place] 3)  [address], [place] [phone](336) [phone]  [phone]   [facility] Child Abuse Hotline [phone]) [phone] or [phone](336) [phone] (After Hours)

## 2014-07-04 NOTE — ED Provider Notes (Signed)
CSN: [id number]     Arrival date & time 07/04/14  1815 History   First MD Initiated Contact with Patient 07/04/14 1828     Chief Complaint  Patient presents with  . Dental Pain  . Vaginal Bleeding     (Consider location/radiation/quality/duration/timing/severity/associated sxs/prior Treatment) Patient is a 23 y.o. female presenting with tooth pain and vaginal bleeding.  Dental Pain Vaginal Bleeding  Patient presents with two complaints.    [id number] on depo provera  (unsure of last dose) p/w vaginal bleeding and suprapubic pain that began three days ago.  States she is using pads and is soaking one pad per hour.  Pain is 7/10, feels like pressure, aching and cramping.  Has had N/V with certain foods, similar to her prior pregnancy, x 2 week.  Is having urinary frequency. States her mother controls her depo provera injections and she therefore doesn't know when her last one was.  She has unprotected sex with one female partner, was STD/HIV tested before they began their monogamous relationship over 1 year ago. Denies fevers, chills, body aches.  Has had change in in BM associated with quitting smoking last week - is having solid stools but feels she is having incomplete emptying.  No blood, no diarrhea.    Pt also was diagnosed with a dental abscess associated with her wisdom teeth that are currently coming in, approximately 3 weeks ago.  She was put on penicillin and followed up with her dentist who referred her to an oral surgeon.  She took approximately 5 days worth of penicillin when she broke out in hives and the swelling had improved in her face so she stopped it.  She has not been able to follow up with the oral surgeon.  Pt has had continued constant pain associated with her lower wisdom teeth on both side.  Mild right sided sore throat.  No fever, no difficulty swallowing or breathing.   History reviewed. No pertinent past medical history. History reviewed. No pertinent past surgical  history. No family history on file. History  Substance Use Topics  . Smoking status: Former Smoker -- 0.50 packs/day  . Smokeless tobacco: Never Used  . Alcohol Use: No   OB History   Grav Para Term Preterm Abortions TAB SAB Ect Mult Living   1              Review of Systems  Genitourinary: Positive for vaginal bleeding.  All other systems reviewed and are negative.     Allergies  Penicillins  Home Medications   Prior to Admission medications   Medication Sig Start Date End Date Taking? Authorizing Provider  HYDROcodone-acetaminophen (NORCO) 5-325 MG per tablet Take 1 tablet by mouth every 6 (six) hours as needed. 06/14/14   April K Palumbo-Rasch, MD   BP 114/64  Pulse 84  Temp(Src) 99 F (37.2 C) (Oral)  Resp 20  Ht 5\' 3"  (1.6 m)  Wt 160 lb (72.576 kg)  BMI 28.35 kg/m2  SpO2 100%  LMP 06/20/2014 Physical Exam  Nursing note and vitals reviewed. Constitutional: She appears well-developed and well-nourished. No distress.  HENT:  Head: Normocephalic and atraumatic.  Mouth/Throat: Uvula is midline, oropharynx is clear and moist and mucous membranes are normal. Normal dentition. No uvula swelling.  Neck: Neck supple.  Cardiovascular: Normal rate and regular rhythm.   Pulmonary/Chest: Effort normal and breath sounds normal. No respiratory distress. She has no wheezes. She has no rales.  Abdominal: Soft. She exhibits no distension and no mass.  There is tenderness in the right lower quadrant and suprapubic area. There is no rebound and no guarding.  Genitourinary: Uterus is tender. Cervix exhibits no motion tenderness. Right adnexum displays tenderness. Right adnexum displays no mass and no fullness. Left adnexum displays no mass, no tenderness and no fullness. No erythema or tenderness around the vagina. No foreign body around the vagina. No signs of injury around the vagina.  Moderate amount of dark red blood in vaginal vault.   Neurological: She is alert.  Skin: She is  not diaphoretic.    ED Course  Procedures (including critical care time) Labs Review Labs Reviewed  WET PREP, GENITAL - Abnormal; Notable for the following:    WBC, Wet Prep HPF POC FEW (*)    All other components within normal limits  URINALYSIS, ROUTINE W REFLEX MICROSCOPIC - Abnormal; Notable for the following:    APPearance CLOUDY (*)    Hgb urine dipstick LARGE (*)    Ketones, ur 15 (*)    Leukocytes, UA SMALL (*)    All other components within normal limits  CBC WITH DIFFERENTIAL - Abnormal; Notable for the following:    HCT 35.8 (*)    All other components within normal limits  GC/CHLAMYDIA PROBE AMP  PREGNANCY, URINE  BASIC METABOLIC PANEL  URINE MICROSCOPIC-ADD ON    Imaging Review US Transvaginal Non-ob  07/04/2014   CLINICAL DATA:  Headache bleeding, pain.  EXAM: TRANSABDOMINAL AND TRANSVAGINAL ULTRASOUND OF PELVIS  DOPPLER ULTRASOUND OF OVARIES  TECHNIQUE: Both transabdominal and transvaginal ultrasound examinations of the pelvis were performed. Transabdominal technique was performed for global imaging of the pelvis including uterus, ovaries, adnexal regions, and pelvic cul-de-sac.  It was necessary to proceed with endovaginal exam following the transabdominal exam to visualize the endometrium and ovaries. Color and duplex Doppler ultrasound was utilized to evaluate blood flow to the ovaries.  COMPARISON:  None.  FINDINGS: Uterus  Measurements: 9.8 x 4.3 x 5.9 cm. No fibroids or other mass visualized.  Endometrium  Thickness: 2 mm.  No focal abnormality visualized.  Right ovary  Measurements: 4.2 x 3.3 x 4.4 cm. 3.7 cm simple appearing cyst noted within the right ovary. Small adjacent daughter cyst.  Left ovary  Measurements: 4.0 x 1.8 x 2.3 cm. Normal appearance/no adnexal mass.  Pulsed Doppler evaluation of both ovaries demonstrates normal low-resistance arterial and venous waveforms.  Other findings  Small amount of free fluid  IMPRESSION: No evidence of ovarian torsion.   3.7 cm simple appearing right ovarian cyst. This is almost certainly benign, and no specific imaging follow up is recommended according to the Society of Radiologists in Ultrasound2010 Consensus Conference Statement (D Lenis Noon et al. Management of Asymptomatic Ovarian and Other Adnexal Cysts Imaged at [place]: Society of Radiologists in Ultrasound Consensus Conference Statement 2010. Radiology 256 (Sept 2010): [id number].).   Electronically Signed   By: Charlett Nose M.D.   On: 07/04/2014 21:40   US Pelvis Complete  07/04/2014   CLINICAL DATA:  Headache bleeding, pain.  EXAM: TRANSABDOMINAL AND TRANSVAGINAL ULTRASOUND OF PELVIS  DOPPLER ULTRASOUND OF OVARIES  TECHNIQUE: Both transabdominal and transvaginal ultrasound examinations of the pelvis were performed. Transabdominal technique was performed for global imaging of the pelvis including uterus, ovaries, adnexal regions, and pelvic cul-de-sac.  It was necessary to proceed with endovaginal exam following the transabdominal exam to visualize the endometrium and ovaries. Color and duplex Doppler ultrasound was utilized to evaluate blood flow to the ovaries.  COMPARISON:  None.  FINDINGS: Uterus  Measurements: 9.8 x 4.3 x 5.9 cm. No fibroids or other mass visualized.  Endometrium  Thickness: 2 mm.  No focal abnormality visualized.  Right ovary  Measurements: 4.2 x 3.3 x 4.4 cm. 3.7 cm simple appearing cyst noted within the right ovary. Small adjacent daughter cyst.  Left ovary  Measurements: 4.0 x 1.8 x 2.3 cm. Normal appearance/no adnexal mass.  Pulsed Doppler evaluation of both ovaries demonstrates normal low-resistance arterial and venous waveforms.  Other findings  Small amount of free fluid  IMPRESSION: No evidence of ovarian torsion.  3.7 cm simple appearing right ovarian cyst. This is almost certainly benign, and no specific imaging follow up is recommended according to the Society of Radiologists in Ultrasound2010 Consensus Conference Statement (D Lenis Noon et al.  Management of Asymptomatic Ovarian and Other Adnexal Cysts Imaged at [place]: Society of Radiologists in Ultrasound Consensus Conference Statement 2010. Radiology 256 (Sept 2010): [id number].).   Electronically Signed   By: Charlett Nose M.D.   On: 07/04/2014 21:40   [place] Art/ven Flow Abd Pelv Doppler  07/04/2014   CLINICAL DATA:  Headache bleeding, pain.  EXAM: TRANSABDOMINAL AND TRANSVAGINAL ULTRASOUND OF PELVIS  DOPPLER ULTRASOUND OF OVARIES  TECHNIQUE: Both transabdominal and transvaginal ultrasound examinations of the pelvis were performed. Transabdominal technique was performed for global imaging of the pelvis including uterus, ovaries, adnexal regions, and pelvic cul-de-sac.  It was necessary to proceed with endovaginal exam following the transabdominal exam to visualize the endometrium and ovaries. Color and duplex Doppler ultrasound was utilized to evaluate blood flow to the ovaries.  COMPARISON:  None.  FINDINGS: Uterus  Measurements: 9.8 x 4.3 x 5.9 cm. No fibroids or other mass visualized.  Endometrium  Thickness: 2 mm.  No focal abnormality visualized.  Right ovary  Measurements: 4.2 x 3.3 x 4.4 cm. 3.7 cm simple appearing cyst noted within the right ovary. Small adjacent daughter cyst.  Left ovary  Measurements: 4.0 x 1.8 x 2.3 cm. Normal appearance/no adnexal mass.  Pulsed Doppler evaluation of both ovaries demonstrates normal low-resistance arterial and venous waveforms.  Other findings  Small amount of free fluid  IMPRESSION: No evidence of ovarian torsion.  3.7 cm simple appearing right ovarian cyst. This is almost certainly benign, and no specific imaging follow up is recommended according to the Society of Radiologists in Ultrasound2010 Consensus Conference Statement (D Lenis Noon et al. Management of Asymptomatic Ovarian and Other Adnexal Cysts Imaged at [place]: Society of Radiologists in Ultrasound Consensus Conference Statement 2010. Radiology 256 (Sept 2010): [phone].).   Electronically Signed   By: Charlett Nose M.D.   On: 07/04/2014 21:40     EKG Interpretation None      MDM   Final diagnoses:  Abnormal uterine bleeding  Pelvic pain in female  Pain, dental    Afebrile nontoxic patient with suprapubic pain and vaginal bleeding.  Pregnancy test is negative.  Hgb is normal.  Pelvic US is normal.  Wet prep without apparent infection.  Pelvic exam with small-moderate amount of dark blood on exam.  Labs unremarkable.  Clinically no e/o PID or cervicitis.  Doubt appendicitis.  Unclear when patient's last depo provera injection was given - pt advised to follow up with her mother who gives her the injections.  Also strongly advised to follow up with her gyn, return or go directly to [facility] hospital for worsening symptoms.  Discussed result, findings, treatment, and follow up  with patient.  Pt given return precautions.  Pt verbalizes understanding and agrees with plan.  Trixie Dredgemily Miia Blanks, PA-C 07/04/14 2205

## 2014-07-04 NOTE — ED Notes (Signed)
Patient states she has had a toothache for the last three weeks.  Was seen here two weeks ago and treated, and stopped the penicillin because the swelling has gone down.  States she saw her dentist and was referred to a oral surgeon, but does not have time to see one.   Also, had a normal menstrual cycle two weeks ago, and started to have another cycle three days ago, which is associated with lower abdominal pain and heavy bleeding with clots.

## 2014-07-05 LAB — GC/CHLAMYDIA PROBE AMP
CT PROBE, AMP APTIMA: NEGATIVE
GC Probe RNA: NEGATIVE

## 2014-07-28 ENCOUNTER — Emergency Department (HOSPITAL_BASED_OUTPATIENT_CLINIC_OR_DEPARTMENT_OTHER)
Admission: EM | Admit: 2014-07-28 | Discharge: 2014-07-29 | Disposition: A | Discharge status: Home / Self Care | Payer: Medicaid Other | Attending: Emergency Medicine | Admitting: Emergency Medicine

## 2014-07-28 ENCOUNTER — Encounter (HOSPITAL_BASED_OUTPATIENT_CLINIC_OR_DEPARTMENT_OTHER): Payer: Self-pay | Admitting: Emergency Medicine

## 2014-07-28 DIAGNOSIS — K0889 Other specified disorders of teeth and supporting structures: Secondary | ICD-10-CM

## 2014-07-28 DIAGNOSIS — Z3202 Encounter for pregnancy test, result negative: Secondary | ICD-10-CM | POA: Insufficient documentation

## 2014-07-28 DIAGNOSIS — R519 Headache, unspecified: Secondary | ICD-10-CM

## 2014-07-28 DIAGNOSIS — K089 Disorder of teeth and supporting structures, unspecified: Secondary | ICD-10-CM | POA: Insufficient documentation

## 2014-07-28 DIAGNOSIS — R51 Headache: Secondary | ICD-10-CM | POA: Insufficient documentation

## 2014-07-28 DIAGNOSIS — Z87891 Personal history of nicotine dependence: Secondary | ICD-10-CM | POA: Insufficient documentation

## 2014-07-28 DIAGNOSIS — Z880 Allergy status to penicillin: Secondary | ICD-10-CM | POA: Insufficient documentation

## 2014-07-28 LAB — URINALYSIS, ROUTINE W REFLEX MICROSCOPIC
Bilirubin Urine: NEGATIVE
GLUCOSE, UA: NEGATIVE mg/dL
Hgb urine dipstick: NEGATIVE
KETONES UR: NEGATIVE mg/dL
LEUKOCYTES UA: NEGATIVE
Nitrite: NEGATIVE
PROTEIN: NEGATIVE mg/dL
Specific Gravity, Urine: 1.026 (ref 1.005–1.030)
Urobilinogen, UA: 1 mg/dL (ref 0.0–1.0)
pH: 6 (ref 5.0–8.0)

## 2014-07-28 LAB — PREGNANCY, URINE: Preg Test, Ur: NEGATIVE

## 2014-07-28 NOTE — ED Notes (Signed)
Pt c/o HA that began yesterday morning. Pt reports no relief from advil. Pt c/o photosensitivity.

## 2014-07-29 MED ORDER — TRAMADOL HCL 50 MG PO TABS: 50.0000 mg | ORAL_TABLET | Freq: Four times a day (QID) | ORAL | Status: DC | PRN

## 2014-07-29 MED ORDER — CLINDAMYCIN HCL 300 MG PO CAPS: 300.0000 mg | ORAL_CAPSULE | Freq: Four times a day (QID) | ORAL | Status: DC

## 2014-07-29 NOTE — Discharge Instructions (Signed)
Dental Pain °A tooth ache may be caused by cavities (tooth decay). Cavities expose the nerve of the tooth to air and hot or cold temperatures. It may come from an infection or abscess (also called a boil or furuncle) around your tooth. It is also often caused by dental caries (tooth decay). This causes the pain you are having. °DIAGNOSIS  °Your caregiver can diagnose this problem by exam. °TREATMENT  °· If caused by an infection, it may be treated with medications which kill germs (antibiotics) and pain medications as prescribed by your caregiver. Take medications as directed. °· Only take over-the-counter or prescription medicines for pain, discomfort, or fever as directed by your caregiver. °· Whether the tooth ache today is caused by infection or dental disease, you should see your dentist as soon as possible for further care. °SEEK MEDICAL CARE IF: °The exam and treatment you received today has been provided on an emergency basis only. This is not a substitute for complete medical or dental care. If your problem worsens or new problems (symptoms) appear, and you are unable to meet with your dentist, call or return to this location. °SEEK IMMEDIATE MEDICAL CARE IF:  °· You have a fever. °· You develop redness and swelling of your face, jaw, or neck. °· You are unable to open your mouth. °· You have severe pain uncontrolled by pain medicine. °MAKE SURE YOU:  °· Understand these instructions. °· Will watch your condition. °· Will get help right away if you are not doing well or get worse. °Document Released: 12/02/2005 Document Revised: 02/24/2012 Document Reviewed: 07/20/2008 °ExitCare® Patient Information ©2015 [employer]. This information is not intended to replace advice given to you by your health care provider. Make sure you discuss any questions you have with your health care provider. ° °Emergency Department Resource Guide °1) Find a Doctor and Pay Out of Pocket °Although you won't have to find out who  is covered by your insurance plan, it is a good idea to ask around and get recommendations. You will then need to call the office and see if the doctor you have chosen will accept you as a new patient and what types of options they offer for patients who are self-pay. Some doctors offer discounts or will set up payment plans for their patients who do not have insurance, but you will need to ask so you aren't surprised when you get to your appointment. ° °2) Contact Your Local Health Department °Not all health departments have doctors that can see patients for sick visits, but many do, so it is worth a call to see if yours does. If you don't know where your local health department is, you can check in your phone book. The CDC also has a tool to help you locate your state's health department, and many state websites also have listings of all of their local health departments. ° °3) Find a Walk-in Clinic °If your illness is not likely to be very severe or complicated, you may want to try a walk in clinic. These are popping up all over the country in pharmacies, drugstores, and shopping centers. They're usually staffed by nurse practitioners or physician assistants that have been trained to treat common illnesses and complaints. They're usually fairly quick and inexpensive. However, if you have serious medical issues or chronic medical problems, these are probably not your best option. ° °No Primary Care Doctor: °- Call [facility] at  832-8000 - they can help you locate a primary   care doctor that  accepts your insurance, provides certain services, etc. °- Physician Referral Service- 1-800-533-3463 ° °Chronic Pain Problems: °Organization         Address  Phone   Notes  °[facility]  (336) 297-2271 Patients need to be referred by their primary care doctor.  ° °Medication Assistance: °Organization         Address  Phone   Notes  °[place] [facility] [address]  [address] °[place], [place] [address] (336) 641-8030 --Must be a resident of [place] °-- Must have NO insurance coverage whatsoever (no Medicaid/ Medicare, etc.) °-- The pt. MUST have a primary care doctor that directs their care regularly and follows them in the community °  °[facility]  (866) 331-1348   °[employer]  (888) 892-1162   ° °Agencies that provide inexpensive medical care: °Organization         Address  Phone   Notes  °[facility]  (336) 832-8035   °[facility]    (336) 832-7272   °[facility] [address] °[place], [place] [address] (336) 832-4777   °[facility] [address], °[place] (336) 271-4999   °[facility]    (336) 373-0678   °[facility]    (336) 272-1050   °[facility] ° [address], [place] Phone:  (336) 832-4444, Fax:  (336) 832-4440 Hours of Operation:  9 am - 6 pm, M-F.  Also accepts Medicaid/Medicare and self-pay.  °[facility] ° [address], [place] Phone: (336) 832-3150, Fax: (336) 832-3151. Hours of Operation:  8:30 am - 5:30 pm, M-F.  Also accepts Medicaid and self-pay.  °[facility] [place] [address], [place] Phone: (336) 878-6027   °[facility] [address], [place], [place] (336)723-1848, Ext. 123 Mondays & Thursdays: 7-9 AM.  First 15 patients are seen on a first come, first serve basis. °  ° °Medicaid-accepting [place] Providers: ° °Organization         Address  Phone   Notes  °[facility] [address], [place] (336) 641-2100 Also accepts self-pay patients.  °[facility] [address], [place] ° (336) 856-9996   °[facility] [address], [place] (336) 288-8857   °[facility] [address] [place] [address], [place] (336) 299-7000   °Veita Bland [address], [place]  ° (336) 373-1557 Only accepts [place] Access Medicaid patients after they have their name applied to their card.  ° °Self-Pay (no insurance) in [place]: ° °Organization         Address  Phone   Notes  °Sickle Cell Patients, [facility] [address], [place] (336) 832-1970   °[facility] [address], [place] (336) 832-4400   °[facility] ° [address],  (336) 992-4800   °[facility]/Dr. Osei-Bonsu ° [address] [place] [address], [place] or [address], [place] (336) 841-8500 Phone number for both [place] and [place] locations is the same.  °[facility] [address], [place] (336) 299-0000   °[facility] [address] [place] [address],  or [address] (336) 852-7530 °(336) 878-2260   °  [facility] [address], [place] (336) 350-1642, phone; (336) 294-5005, fax Sees patients 1st and 3rd Saturday of every month.  Must not qualify for public or private insurance (i.e. Medicaid, Medicare, [place] Health Choice, Veterans' Benefits) • Household income should be no more than 200% of the poverty level •The clinic cannot treat you if you are pregnant or think you are pregnant • Sexually transmitted diseases are not treated at the clinic.  ° ° °Dental Care: °Organization         Address  Phone  Notes  °[facility] [facility] [address], [place] (336) 641-6152 Accepts children up to age 21 who are enrolled in Medicaid or [place] Health Choice; pregnant women with a Medicaid card; and children who have applied for Medicaid or [place] Health Choice, but were declined, whose parents can pay a reduced fee at time of service.  °[facility] [place]  [address], [place] (336) 641-7733 Accepts children up to age 21 who are  enrolled in Medicaid or [place] Health Choice; pregnant women with a Medicaid card; and children who have applied for Medicaid or [place] Health Choice, but were declined, whose parents can pay a reduced fee at time of service.  °[facility] ° [address], [place] (336) 641-4533 Patients are seen by appointment only. Walk-ins are not accepted. [facility] will see patients 18 years of age and older. °Monday - Tuesday (8am-5pm) °Most Wednesdays (8:30-5pm) °$30 per visit, cash only  °[facility] ° [address], [place] (336) 641-4533 Patients are seen by appointment only. Walk-ins are not accepted. [facility] will see patients 18 years of age and older. °One Wednesday Evening (Monthly: Volunteer Based).  $30 per visit, cash only  °[facility]  (919) 537-3737 for adults; Children under age 4, call Graduate Pediatric Dentistry at (919) [phone]. Children aged 4-14, please call (919) [phone] to request a pediatric application. ° Dental services are provided in all areas of dental care including fillings, crowns and bridges, complete and partial dentures, implants, gum treatment, root canals, and extractions. Preventive care is also provided. Treatment is provided to both adults and children. °Patients are selected via a lottery and there is often a waiting list. °  °[facility] [address], °[place] ° (336) 763-8833 [email or web address] °  °[facility] [address], [place], [place] (336)723-1848, Ext. 123 Second and Fourth Thursday of each month, opens at 6:30 AM; Clinic ends at 9 AM.  Patients are seen on a first-come first-served basis, and a limited number are seen during each clinic.  ° °[facility] ° [address], [place], [place] (336) 723-7904   Eligibility Requirements °You must have lived in [place], [place], or [place] counties for at least the last three months. °  You  cannot be eligible for state or federal sponsored healthcare insurance, including Veterans Administration, Medicaid, or Medicare. °  You generally cannot be eligible for healthcare insurance through your employer.  °  How to apply: °Eligibility screenings are held every Tuesday and Wednesday afternoon from 1:00 pm until 4:00 pm. You do not need an appointment for the interview!  °[facility] [address], [place], [place] 336-631-2330   °[facility]  336-342-8273   °[facility]  336-703-3100   °[facility]  336-570-6415   ° °Behavioral Health Resources in the Community: °Intensive Outpatient Programs °Organization         Address  Phone  Notes  °[facility] [address], [place], [place] 336-878-6098   °[facility] [address], [place], [place] 336-832-9800   °[facility] [address], [place], [place] ° 336-882-2125   °[facility] [address],  °[place], [place] 1-800-853-5163 or 336-641-4981   °Substance Abuse Resources °Organization         Address  Phone  Notes  °[facility]  336-882-2125   °[facility]  336-784-9470   °[facility]  336-285-9073   °[facility]  336-845-3988   °Residential & Outpatient Substance Abuse Program  1-800-659-3381   °Psychological Services °Organization         Address  Phone  Notes  °[facility]  336- 832-9600   °[facility]  336- 378-7881   °[facility] [address], [place] 1-800-853-5163 or 336-641-4981   ° °Mobile Crisis Teams °Organization         Address  Phone  Notes  °[facility], Mobile Crisis Care Unit  1-877-626-1772   °Assertive °[facility] ° [address] [place], [place] 336-834-9664   °Sharon DeEsch [address] °[place] 336-554-5454   ° °Self-Help/Support  Groups °Organization         Address  Phone             Notes  °[facility] [place] - variety of support groups  336- 373-1402 Call for more information  °Narcotics Anonymous (NA), [facility] [address], °[place]  2 meetings at this location  ° °Residential Treatment Programs °Organization         Address  Phone  Notes  °[facility] [address],    °[place]  1-866-801-8205   °[facility] ° [address], [place], [place] 704-293-8524   °[facility] [address], [place] 336-845-3988 Admissions: 8am-3pm M-F  °[facility] [address]    °[place], [place] 336-841-1104   °[facility] [address], [place], [place] 336-379-7146   °[facility] [address]  °[place], [place] 336-285-9073   °[facility] Intensive Outpatient [address], [place], [place] 336-852-3033   °[facility] [address]  °[place], [place] 1-877-615-2722 or 336-784-9470   °[facility] [address] [place], [place] 336-227-7417 Accepts Medicaid  °[facility] [address]  °[place] 1-800-659-3381 Substance Abuse/Addiction Treatment  ° °[place] Behavioral Health Resources °Organization         Address  Phone  Notes  °[facility]  (888) 581-9988   °Julie Brannon, PhD [address] [place], [place]   (336) 349-5553 or (336) 951-0000   °[facility]   [address] °[place], [place] (336) 349-4454   °[facility] [address], [place], [place] (336) 342-8316 Insurance/Medicaid/sponsorship through [facility]  °[facility] [address]                                    [place], [place] (336) 342-8316 Therapy/tele-psych/case  °[facility]   [address]  ° [place], [place] (336) 349-2233    °Dr. Arfeen  (336) 349-4544   °[facility] [place]  [employer] [facility] [place] [facility] 1) [address], [place] °2) [address], [place] °3)  [address], [place] (336) 349-3220 °(336) 342-7768 ° °(336) 342-8140   °[place] [facility] (336) 342-1394 or (336) 342-3537 (After Hours)    ° ° ° °

## 2014-07-29 NOTE — ED Provider Notes (Signed)
CSN: [id number]     Arrival date & time 07/28/14  2206 History   First MD Initiated Contact with Patient 07/29/14 0003     Chief Complaint  Patient presents with  . Headache     (Consider location/radiation/quality/duration/timing/severity/associated sxs/prior Treatment) HPI Comments: Patient is a 23 year old female with no significant past medical history. She presents today with complaints of headache that she states is related to her dental issues. She has a wisdom tooth that is decayed and is due to be removed. She had similar symptoms several months ago and was prescribed antibiotics and pain medication and improved. She has had problems with insurance to cover her dental visit so she has not yet followed up. Her tooth began hurting again yesterday and is now causing her head to hurt. She denies any fevers or stiff neck. She denies any visual disturbances.  Patient is a 23 y.o. female presenting with headaches. The history is provided by the patient.  Headache Pain location:  R temporal Quality:  Stabbing Radiates to:  Does not radiate Onset quality:  Gradual Duration:  2 days Timing:  Constant Progression:  Worsening Chronicity:  Recurrent   History reviewed. No pertinent past medical history. History reviewed. No pertinent past surgical history. No family history on file. History  Substance Use Topics  . Smoking status: Former Smoker -- 0.50 packs/day  . Smokeless tobacco: Never Used  . Alcohol Use: No   OB History   Grav Para Term Preterm Abortions TAB SAB Ect Mult Living   1              Review of Systems  Neurological: Positive for headaches.  All other systems reviewed and are negative.     Allergies  Penicillins  Home Medications   Prior to Admission medications   Medication Sig Start Date End Date Taking? Authorizing Provider  HYDROcodone-acetaminophen (NORCO) 5-325 MG per tablet Take 1 tablet by mouth every 6 (six) hours as needed. 06/14/14   April Smitty CordsK  Palumbo-Rasch, MD  HYDROcodone-acetaminophen (NORCO/VICODIN) 5-325 MG per tablet Take 1 tablet by mouth every 4 (four) hours as needed for moderate pain or severe pain. 07/04/14   Trixie DredgeEmily West, PA-C   BP 128/79  Pulse 78  Temp(Src) 97.9 F (36.6 C) (Oral)  Resp 18  Ht 5\' 2"  (1.575 m)  Wt 150 lb (68.04 kg)  BMI 27.43 kg/m2  SpO2 98%  LMP 07/11/2014 Physical Exam  Nursing note and vitals reviewed. Constitutional: She is oriented to person, place, and time. She appears well-developed and well-nourished. No distress.  HENT:  Head: Normocephalic and atraumatic.  Mouth/Throat: Oropharynx is clear and moist.  Dentition appears intact. There is no evidence for abscess or gingival inflammation.  Eyes: EOM are normal. Pupils are equal, round, and reactive to light.  There is no papilledema on funduscopic exam.  Neck: Normal range of motion. Neck supple.  Lymphadenopathy:    She has no cervical adenopathy.  Neurological: She is alert and oriented to person, place, and time. No cranial nerve deficit. She exhibits normal muscle tone. Coordination normal.  Skin: Skin is warm and dry. She is not diaphoretic.    ED Course  Procedures (including critical care time) Labs Review Labs Reviewed  URINALYSIS, ROUTINE W REFLEX MICROSCOPIC  PREGNANCY, URINE    Imaging Review No results found.   EKG Interpretation None      MDM   Final diagnoses:  None    We'll treat with antibiotics and tramadol. She is to followup  with her dentist. She is neurologically intact and I doubt any serious intracranial pathology.    Geoffery Lyons, MD 07/29/14 [id number]

## 2014-10-17 ENCOUNTER — Encounter (HOSPITAL_BASED_OUTPATIENT_CLINIC_OR_DEPARTMENT_OTHER): Payer: Self-pay | Admitting: Emergency Medicine

## 2015-04-06 ENCOUNTER — Encounter (HOSPITAL_BASED_OUTPATIENT_CLINIC_OR_DEPARTMENT_OTHER): Payer: Self-pay | Admitting: Emergency Medicine

## 2015-04-06 ENCOUNTER — Emergency Department (HOSPITAL_BASED_OUTPATIENT_CLINIC_OR_DEPARTMENT_OTHER): Payer: Medicaid Other

## 2015-04-06 ENCOUNTER — Emergency Department (HOSPITAL_BASED_OUTPATIENT_CLINIC_OR_DEPARTMENT_OTHER)
Admission: EM | Admit: 2015-04-06 | Discharge: 2015-04-06 | Disposition: A | Discharge status: Other Acute Inpatient Hospital | Payer: Medicaid Other | Attending: Emergency Medicine | Admitting: Emergency Medicine

## 2015-04-06 DIAGNOSIS — O9989 Other specified diseases and conditions complicating pregnancy, childbirth and the puerperium: Secondary | ICD-10-CM | POA: Insufficient documentation

## 2015-04-06 DIAGNOSIS — R1084 Generalized abdominal pain: Secondary | ICD-10-CM | POA: Insufficient documentation

## 2015-04-06 DIAGNOSIS — Z880 Allergy status to penicillin: Secondary | ICD-10-CM | POA: Insufficient documentation

## 2015-04-06 DIAGNOSIS — Z3A00 Weeks of gestation of pregnancy not specified: Secondary | ICD-10-CM | POA: Diagnosis not present

## 2015-04-06 DIAGNOSIS — Z87891 Personal history of nicotine dependence: Secondary | ICD-10-CM | POA: Diagnosis not present

## 2015-04-06 DIAGNOSIS — Z3490 Encounter for supervision of normal pregnancy, unspecified, unspecified trimester: Secondary | ICD-10-CM

## 2015-04-06 LAB — WET PREP, GENITAL
Trich, Wet Prep: NONE SEEN
WBC, Wet Prep HPF POC: NONE SEEN
YEAST WET PREP: NONE SEEN

## 2015-04-06 LAB — URINALYSIS, ROUTINE W REFLEX MICROSCOPIC
BILIRUBIN URINE: NEGATIVE
Glucose, UA: NEGATIVE mg/dL
Hgb urine dipstick: NEGATIVE
KETONES UR: NEGATIVE mg/dL
Nitrite: NEGATIVE
Protein, ur: NEGATIVE mg/dL
SPECIFIC GRAVITY, URINE: 1.019 (ref 1.005–1.030)
Urobilinogen, UA: 0.2 mg/dL (ref 0.0–1.0)
pH: 7 (ref 5.0–8.0)

## 2015-04-06 LAB — URINE MICROSCOPIC-ADD ON

## 2015-04-06 MED ORDER — SODIUM CHLORIDE 0.9 % IV BOLUS (SEPSIS)
1000.0000 mL | Freq: Once | INTRAVENOUS | Status: AC
  Administered 2015-04-06: 1000 mL via INTRAVENOUS

## 2015-04-06 NOTE — ED Notes (Signed)
Pelvic cart at bedside. 

## 2015-04-06 NOTE — ED Provider Notes (Signed)
CSN: [id number]     Arrival date & time 04/06/15  1625 History   First MD Initiated Contact with Patient 04/06/15 1649     Chief Complaint  Patient presents with  . Abdominal Pain     (Consider location/radiation/quality/duration/timing/severity/associated sxs/prior Treatment) Patient is a 24 y.o. female presenting with abdominal pain. The history is provided by the patient.  Abdominal Pain Pain location:  Generalized Pain quality: aching and sharp   Pain radiates to:  Does not radiate Pain severity:  Moderate Onset quality:  Gradual Duration:  3 days Timing:  Constant Progression:  Unchanged Chronicity:  New Context: not eating, not previous surgeries, not recent illness and not trauma   Relieved by:  Nothing Worsened by:  Nothing tried Associated symptoms: no cough, no fever, no shortness of breath and no vomiting     No past medical history on file. No past surgical history on file. No family history on file. History  Substance Use Topics  . Smoking status: Former Smoker -- 0.50 packs/day  . Smokeless tobacco: Never Used  . Alcohol Use: No   OB History    Gravida Para Term Preterm AB TAB SAB Ectopic Multiple Living   2              Review of Systems  Constitutional: Negative for fever.  Respiratory: Negative for cough and shortness of breath.   Gastrointestinal: Negative for vomiting and abdominal pain.  All other systems reviewed and are negative.     Allergies  Penicillins  Home Medications   Prior to Admission medications   Not on File   BP 113/93 mmHg  Pulse 110  Resp 20  Ht  (1.6 m)  Wt 170 lb (77.111 kg)  BMI 30.12 kg/m2  SpO2 100%  LMP 07/11/2014 Physical Exam  Constitutional: She is oriented to person, place, and time. She appears well-developed and well-nourished. No distress.  HENT:  Head: Normocephalic and atraumatic.  Mouth/Throat: Oropharynx is clear and moist.  Eyes: EOM are normal. Pupils are equal, round, and reactive to  light.  Neck: Normal range of motion. Neck supple.  Cardiovascular: Normal rate and regular rhythm.  Exam reveals no friction rub.   No murmur heard. Pulmonary/Chest: Effort normal and breath sounds normal. No respiratory distress. She has no wheezes. She has no rales.  Abdominal: Soft. She exhibits no distension. There is tenderness (mild) in the suprapubic area. There is no rebound.  Gravid uterus, consistent with dates. Abdomen distended due to pregnancy. Fundal height a Falkner more than halfway between umbilicus and xyphoid. No rebound or guarding. Mild suprapubic tenderness.  Genitourinary: Cervix exhibits motion tenderness. Cervix exhibits no discharge and no friability. Right adnexum displays no mass and no tenderness. Left adnexum displays no mass and no tenderness.    Musculoskeletal: Normal range of motion. She exhibits no edema.  Neurological: She is alert and oriented to person, place, and time.  Skin: She is not diaphoretic.  Nursing note and vitals reviewed.   ED Course  Procedures (including critical care time) Labs Review Labs Reviewed  WET PREP, GENITAL - Abnormal; Notable for the following:    Clue Cells Wet Prep HPF POC MODERATE (*)    All other components within normal limits  URINALYSIS, ROUTINE W REFLEX MICROSCOPIC    Imaging Review No results found.   EKG Interpretation None      MDM   Final diagnoses:  Generalized abdominal pain  Pregnant    24 year old female here with abdominal pain.  She's G2 P1 001, here with 3 days of abdominal pain, listed as sharp. Had nausea, vomiting, diarrhea 1 week ago. No fevers. No gush of fluid or vaginal bleeding. She has noticed increased discharge over the past few days, clear. Here unable to feel the cervix, however noted to be closed and high on a film exam. She has multiple small white nodules and vesicles on the cervix. She has some cervical motion tenderness. She has no discharge in the vault. No pooling of  fluid. Do not have ability to get fetal fibronectin or check for ferning. Mild suprapubic tenderness, no generalized pain as she describes. No rebound or guarding. We'll ultrasound her belly. She's been on tocometry, had one contraction around 5:06, fetal heart rate did not decelerate. Fetal heart rate around 140s-160s.  [facility] Altamese [facility]ansing [facility], who agreed to watch patient, will send for further monitoring.  Elwin MochaBlair Edwardo Wojnarowski, MD 04/06/15 [phone]

## 2015-04-06 NOTE — Progress Notes (Signed)
EFM previously applied by ED RN, Asencion IslamMarva.  [facility] RN assessing FHR and UC's now.

## 2015-04-06 NOTE — ED Notes (Signed)
[facility]

## 2015-04-06 NOTE — ED Notes (Signed)
Lower abdominal pain for three days but worse today.  Some vaginal discharge, no dysuria.

## 2015-04-06 NOTE — ED Notes (Signed)
MD at bedside. 

## 2015-04-06 NOTE — ED Notes (Signed)
[facility] is transporting patient to [facility]

## 2015-04-07 LAB — GC/CHLAMYDIA PROBE AMP (~~LOC~~) NOT AT ARMC
Chlamydia: NEGATIVE
Neisseria Gonorrhea: NEGATIVE

## 2015-10-28 IMAGING — US US ART/VEN ABD/PELV/SCROTUM DOPPLER LTD
1 series · 13 of 25 positions shown · non-contrast
Comparison: None.

CLINICAL DATA: Headache bleeding, pain.

EXAM:
TRANSABDOMINAL AND TRANSVAGINAL ULTRASOUND OF PELVIS
DOPPLER ULTRASOUND OF OVARIES
TECHNIQUE: Both transabdominal and transvaginal ultrasound examinations of the
pelvis were performed. Transabdominal technique was performed for
global imaging of the pelvis including uterus, ovaries, adnexal
regions, and pelvic cul-de-sac.
It was necessary to proceed with endovaginal exam following the
transabdominal exam to visualize the endometrium and ovaries. Color
and duplex Doppler ultrasound was utilized to evaluate blood flow to
the ovaries.

[Series 1: us art/ven abd/pelv/scrotum doppler ltd · 0.32mm/px · 13 of 35 slices shown]
[im 1/35]
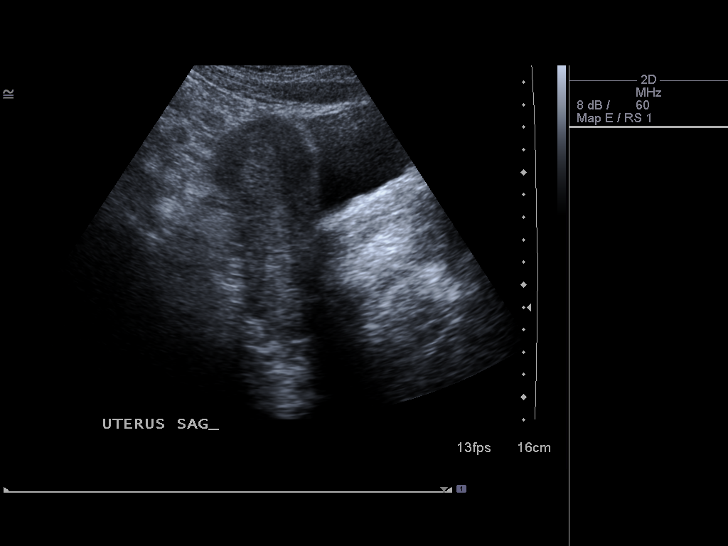
[im 3/35]
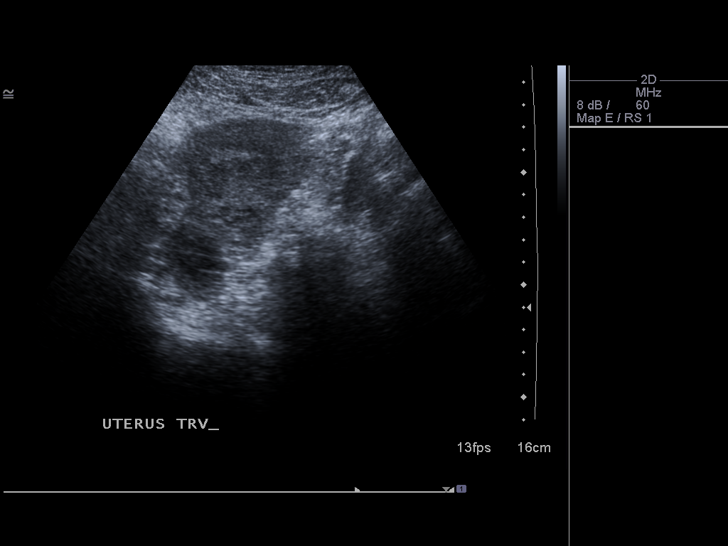
[im 6/35]
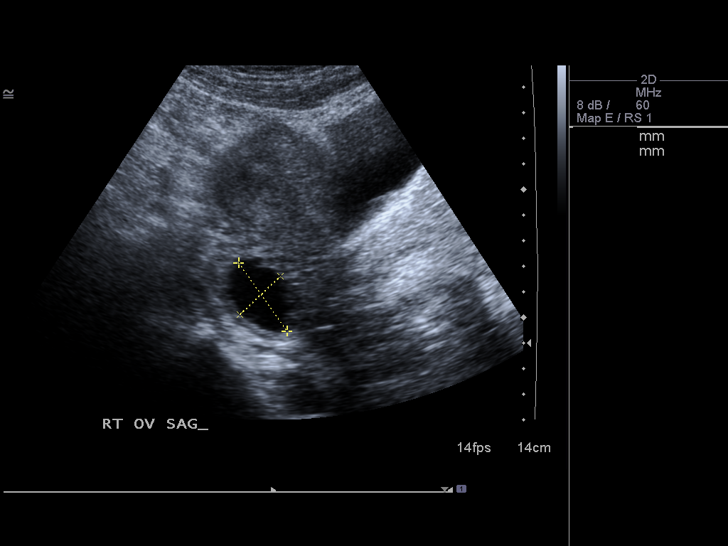
[im 9/35]
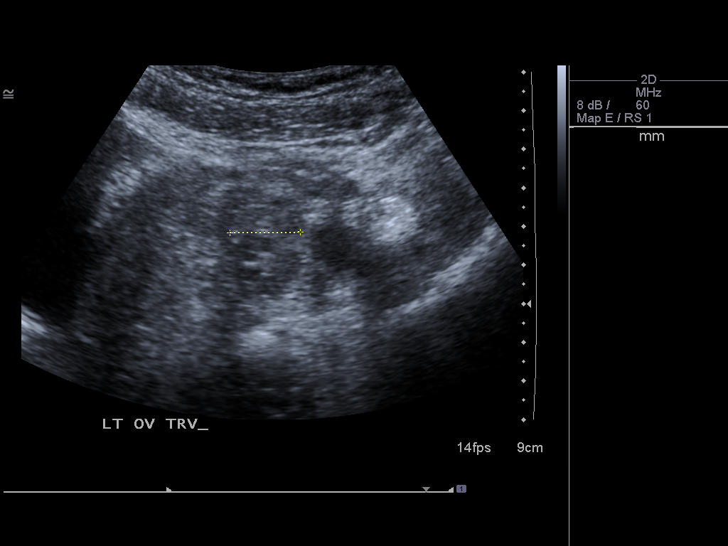
[im 12/35]
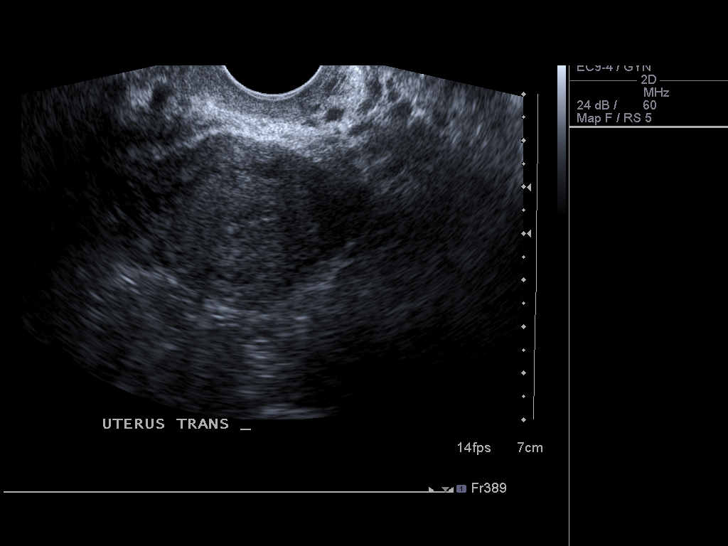
[im 15/35]
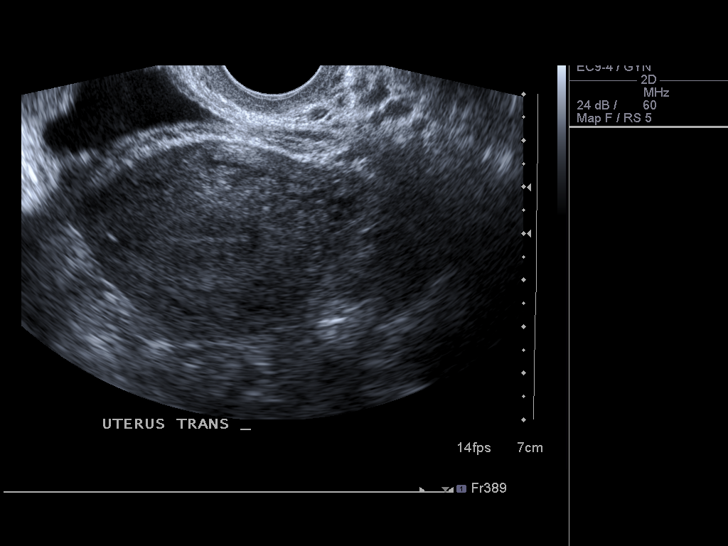
[im 18/35]
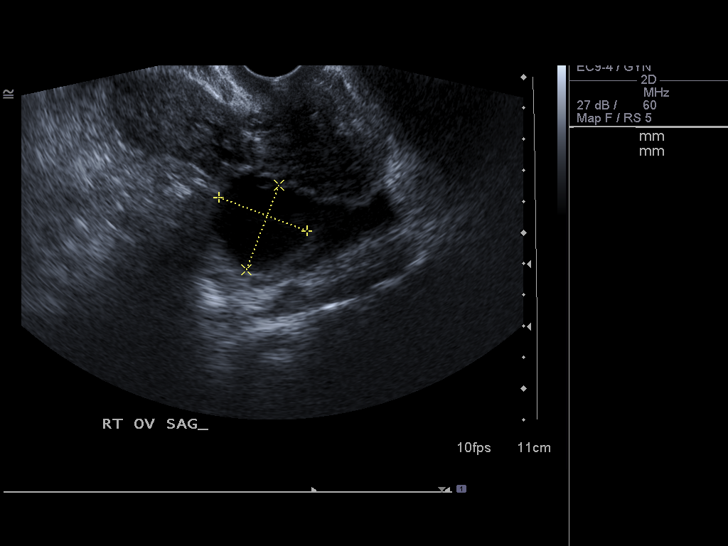
[im 20/35]
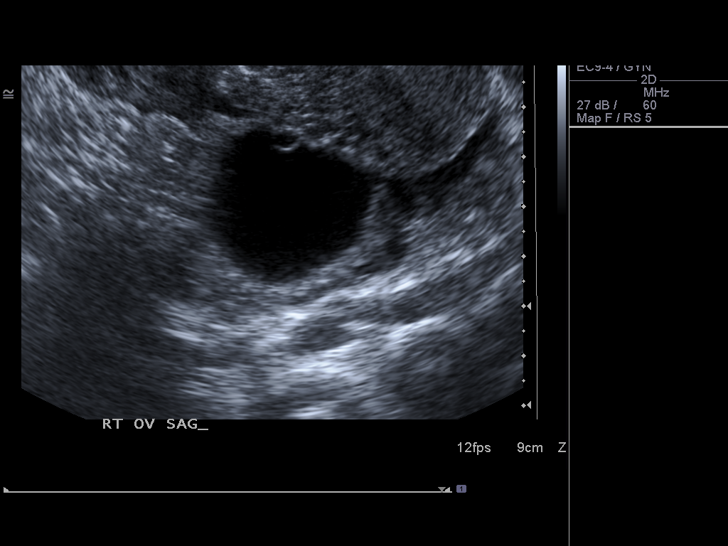
[im 23/35]
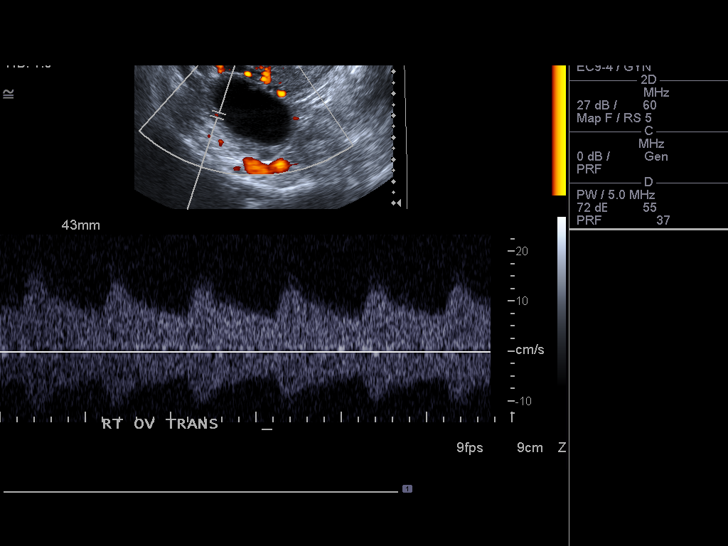
[im 26/35]
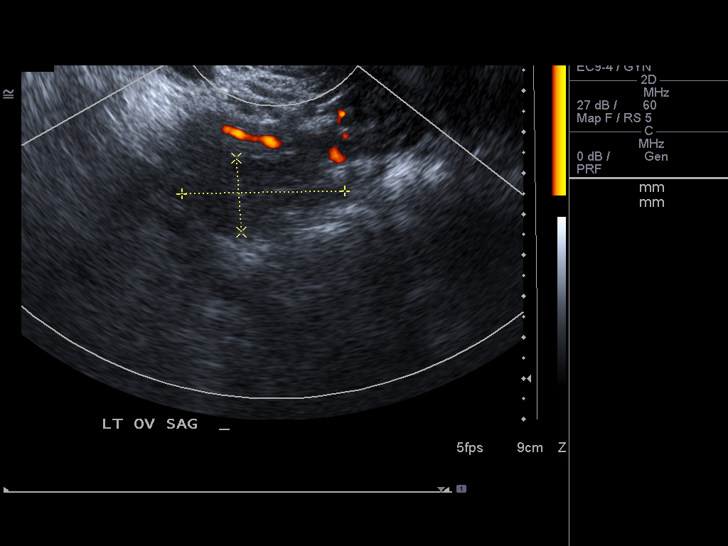
[im 29/35]
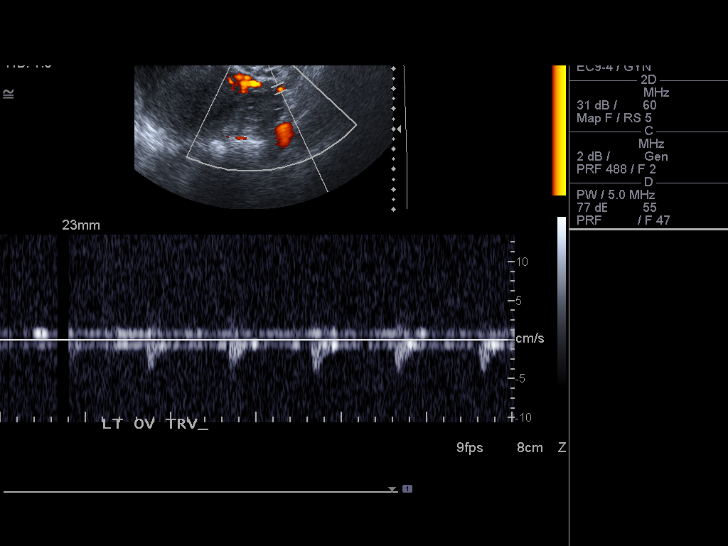
[im 32/35]
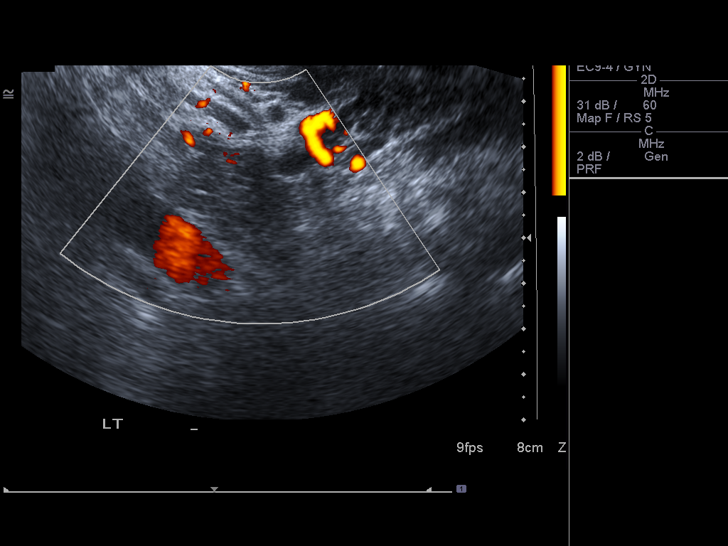
[im 35/35]
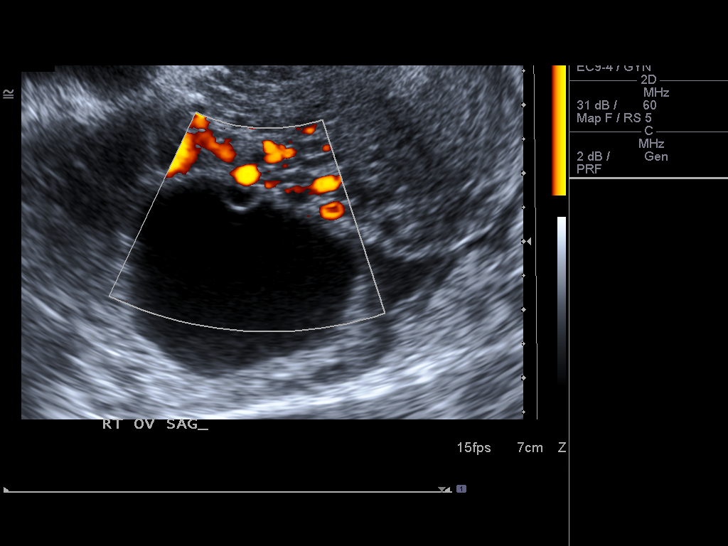

[13 of 25 positions shown; findings below may reference images not displayed]

FINDINGS: Uterus

Measurements: 9.8 x 4.3 x 5.9 cm. No fibroids or other mass
visualized.

Endometrium

Thickness: 2 mm.  No focal abnormality visualized.

Right ovary

Measurements: 4.2 x 3.3 x 4.4 cm. 3.7 cm simple appearing cyst noted
within the right ovary. Small adjacent daughter cyst.

Left ovary

Measurements: 4.0 x 1.8 x 2.3 cm. Normal appearance/no adnexal mass.

Pulsed Doppler evaluation of both ovaries demonstrates normal
low-resistance arterial and venous waveforms.

Other findings

Small amount of free fluid
IMPRESSION: No evidence of ovarian torsion.

3.7 cm simple appearing right ovarian cyst. This is almost certainly
benign, and no specific imaging follow up is recommended according
to the Society of Radiologists in Xltrasound7SKS Consensus
Conference Statement (Cassio Kundu et al. Management of Asymptomatic
Ovarian and Other Adnexal Cysts Imaged at US: Society of
Radiologists in Ultrasound Consensus Conference Statement 2919.
Radiology [DATE]): [id number].).

## 2016-01-04 ENCOUNTER — Emergency Department (HOSPITAL_BASED_OUTPATIENT_CLINIC_OR_DEPARTMENT_OTHER)
Admission: EM | Admit: 2016-01-04 | Discharge: 2016-01-04 | Disposition: A | Discharge status: Home / Self Care | Payer: Medicaid Other | Attending: Emergency Medicine | Admitting: Emergency Medicine

## 2016-01-04 ENCOUNTER — Encounter (HOSPITAL_BASED_OUTPATIENT_CLINIC_OR_DEPARTMENT_OTHER): Payer: Self-pay

## 2016-01-04 DIAGNOSIS — Z880 Allergy status to penicillin: Secondary | ICD-10-CM | POA: Insufficient documentation

## 2016-01-04 DIAGNOSIS — Z87891 Personal history of nicotine dependence: Secondary | ICD-10-CM | POA: Insufficient documentation

## 2016-01-04 DIAGNOSIS — M545 Low back pain: Secondary | ICD-10-CM | POA: Diagnosis present

## 2016-01-04 DIAGNOSIS — M62830 Muscle spasm of back: Secondary | ICD-10-CM | POA: Insufficient documentation

## 2016-01-04 DIAGNOSIS — Z3202 Encounter for pregnancy test, result negative: Secondary | ICD-10-CM | POA: Insufficient documentation

## 2016-01-04 LAB — URINE MICROSCOPIC-ADD ON

## 2016-01-04 LAB — URINALYSIS, ROUTINE W REFLEX MICROSCOPIC
Bilirubin Urine: NEGATIVE
Glucose, UA: NEGATIVE mg/dL
Ketones, ur: NEGATIVE mg/dL
LEUKOCYTES UA: NEGATIVE
NITRITE: NEGATIVE
PH: 7 (ref 5.0–8.0)
Protein, ur: NEGATIVE mg/dL
SPECIFIC GRAVITY, URINE: 1.019 (ref 1.005–1.030)

## 2016-01-04 LAB — PREGNANCY, URINE: Preg Test, Ur: NEGATIVE

## 2016-01-04 MED ORDER — PREDNISONE 50 MG PO TABS
60.0000 mg | ORAL_TABLET | Freq: Once | ORAL | Status: AC
  Administered 2016-01-04: 60 mg via ORAL
  Filled 2016-01-04: qty 1

## 2016-01-04 MED ORDER — METHOCARBAMOL 500 MG PO TABS: 500.0000 mg | ORAL_TABLET | Freq: Two times a day (BID) | ORAL | Status: DC

## 2016-01-04 MED ORDER — DICLOFENAC SODIUM ER 100 MG PO TB24: 100.0000 mg | ORAL_TABLET | Freq: Every day | ORAL | Status: DC

## 2016-01-04 MED ORDER — METHOCARBAMOL 500 MG PO TABS
1000.0000 mg | ORAL_TABLET | Freq: Once | ORAL | Status: AC
  Administered 2016-01-04: 1000 mg via ORAL
  Filled 2016-01-04: qty 2

## 2016-01-04 MED ORDER — LIDOCAINE 5 % EX PTCH: 1.0000 | MEDICATED_PATCH | CUTANEOUS | Status: DC

## 2016-01-04 MED ORDER — NAPROXEN 250 MG PO TABS
500.0000 mg | ORAL_TABLET | Freq: Once | ORAL | Status: AC
Start: 2016-01-04 — End: 2016-01-04
  Administered 2016-01-04: 500 mg via ORAL
  Filled 2016-01-04: qty 2

## 2016-01-04 NOTE — ED Notes (Signed)
Left side middle back pain for the last couple of days, not relieved by OTC meds

## 2016-01-04 NOTE — ED Notes (Signed)
Pt verbalizes understanding of d/c instructions and denies any further needs at this time. 

## 2016-01-04 NOTE — ED Provider Notes (Signed)
CSN: [id number]     Arrival date & time 01/04/16  0427 History   First MD Initiated Contact with Patient 01/04/16 0444     Chief Complaint  Patient presents with  . Back Pain     (Consider location/radiation/quality/duration/timing/severity/associated sxs/prior Treatment) Patient is a 25 y.o. female presenting with back pain. The history is provided by the patient.  Back Pain Location:  Lumbar spine Quality:  Stabbing Radiates to:  Does not radiate Pain severity:  Moderate Pain is:  Same all the time Onset quality:  Sudden Timing:  Constant Progression:  Unchanged Chronicity:  New Context comment:  Lifting her 20 lb infant Relieved by:  Nothing Worsened by:  Nothing tried Ineffective treatments:  None tried Associated symptoms: no abdominal pain, no abdominal swelling, no bladder incontinence, no bowel incontinence, no chest pain, no dysuria, no fever, no headaches, no leg pain, no numbness, no paresthesias, no pelvic pain, no perianal numbness, no tingling, no weakness and no weight loss   Risk factors: no hx of cancer, not obese and not pregnant     History reviewed. No pertinent past medical history. History reviewed. No pertinent past surgical history. No family history on file. Social History  Substance Use Topics  . Smoking status: Former Smoker -- 0.50 packs/day  . Smokeless tobacco: Never Used  . Alcohol Use: No   OB History    Gravida Para Term Preterm AB TAB SAB Ectopic Multiple Living   2              Review of Systems  Constitutional: Negative for fever and weight loss.  Cardiovascular: Negative for chest pain.  Gastrointestinal: Negative for abdominal pain and bowel incontinence.  Genitourinary: Negative for bladder incontinence, dysuria, difficulty urinating and pelvic pain.  Musculoskeletal: Positive for back pain.  Neurological: Negative for tingling, weakness, numbness, headaches and paresthesias.  All other systems reviewed and are  negative.     Allergies  Penicillins  Home Medications   Prior to Admission medications   Medication Sig Start Date End Date Taking? Authorizing Provider  Diclofenac Sodium CR 100 MG 24 hr tablet Take 1 tablet (100 mg total) by mouth daily. 01/04/16   Liliahna Cudd, MD  lidocaine (LIDODERM) 5 % Place 1 patch onto the skin daily. Remove & Discard patch within 12 hours or as directed by MD 01/04/16   Ronetta Molla, MD  methocarbamol (ROBAXIN) 500 MG tablet Take 1 tablet (500 mg total) by mouth 2 (two) times daily. 01/04/16   Juletta Berhe, MD   BP 140/96 mmHg  Pulse 85  Temp(Src) 98.2 F (36.8 C) (Oral)  Resp 18  Ht  (1.6 m)  Wt 150 lb (68.04 kg)  BMI 26.58 kg/m2  SpO2 99%  LMP 12/29/2015  Breastfeeding? Unknown Physical Exam  Constitutional: She is oriented to person, place, and time. She appears well-developed and well-nourished.  HENT:  Head: Normocephalic and atraumatic.  Mouth/Throat: Oropharynx is clear and moist.  Eyes: Conjunctivae are normal. Pupils are equal, round, and reactive to light.  Neck: Normal range of motion. Neck supple.  Cardiovascular: Normal rate, regular rhythm and intact distal pulses.   Pulmonary/Chest: Effort normal and breath sounds normal. No respiratory distress. She has no wheezes. She has no rales. She exhibits no tenderness.  Abdominal: Soft. Bowel sounds are normal. There is no tenderness. There is no rebound and no guarding.  Musculoskeletal: Normal range of motion. She exhibits no edema or tenderness.       Cervical back: Normal.  Thoracic back: Normal.       Lumbar back: Normal.       Back:  No cords  Neurological: She is alert and oriented to person, place, and time. She has normal reflexes.  Skin: Skin is warm and dry.  Psychiatric: She has a normal mood and affect.    ED Course  Procedures (including critical care time) Labs Review Labs Reviewed  URINALYSIS, ROUTINE W REFLEX MICROSCOPIC (NOT AT [facility]) - Abnormal;  Notable for the following:    Hgb urine dipstick TRACE (*)    All other components within normal limits  URINE MICROSCOPIC-ADD ON - Abnormal; Notable for the following:    Squamous Epithelial / LPF 0-5 (*)    Bacteria, UA RARE (*)    All other components within normal limits  PREGNANCY, URINE    Imaging Review No results found. I have personally reviewed and evaluated these images and lab results as part of my medical decision-making.   EKG Interpretation None      MDM   Final diagnoses:  Lumbar paraspinal muscle spasm    No leg pain or swelling had one dose of tylenol only.  Will treat with NSAIDs, muscle relaxants lidoderm patches and heat.  Follow up with your PMD    Kaliope Quinonez, MD 01/04/16 [phone]

## 2016-01-04 NOTE — Discharge Instructions (Signed)
Heat Therapy  Heat therapy can help ease sore, stiff, injured, and tight muscles and joints. Heat relaxes your muscles, which may help ease your pain.   RISKS AND COMPLICATIONS  If you have any of the following conditions, do not use heat therapy unless your health care provider has approved:   Poor circulation.   Healing wounds or scarred skin in the area being treated.   Diabetes, heart disease, or high blood pressure.   Not being able to feel (numbness) the area being treated.   Unusual swelling of the area being treated.   Active infections.   Blood clots.   Cancer.   Inability to communicate pain. This may include young children and people who have problems with their brain function (dementia).   Pregnancy.  Heat therapy should only be used on old, pre-existing, or long-lasting (chronic) injuries. Do not use heat therapy on new injuries unless directed by your health care provider.  HOW TO USE HEAT THERAPY  There are several different kinds of heat therapy, including:   Moist heat pack.   Warm water bath.   Hot water bottle.   Electric heating pad.   Heated gel pack.   Heated wrap.   Electric heating pad.  Use the heat therapy method suggested by your health care provider. Follow your health care provider's instructions on when and how to use heat therapy.  GENERAL HEAT THERAPY RECOMMENDATIONS   Do not sleep while using heat therapy. Only use heat therapy while you are awake.   Your skin may turn pink while using heat therapy. Do not use heat therapy if your skin turns red.   Do not use heat therapy if you have new pain.   High heat or long exposure to heat can cause burns. Be careful when using heat therapy to avoid burning your skin.   Do not use heat therapy on areas of your skin that are already irritated, such as with a rash or sunburn.  SEEK MEDICAL CARE IF:   You have blisters, redness, swelling, or numbness.   You have new pain.   Your pain is worse.  MAKE SURE  YOU:   Understand these instructions.   Will watch your condition.   Will get help right away if you are not doing well or get worse.     This information is not intended to replace advice given to you by your health care provider. Make sure you discuss any questions you have with your health care provider.     Document Released: 02/24/2012 Document Revised: 12/23/2014 Document Reviewed: 01/25/2014  Elsevier Interactive Patient Education 2016 [employer]

## 2016-03-12 ENCOUNTER — Encounter (HOSPITAL_BASED_OUTPATIENT_CLINIC_OR_DEPARTMENT_OTHER): Payer: Self-pay | Admitting: *Deleted

## 2016-03-12 ENCOUNTER — Emergency Department (HOSPITAL_BASED_OUTPATIENT_CLINIC_OR_DEPARTMENT_OTHER)
Admission: EM | Admit: 2016-03-12 | Discharge: 2016-03-12 | Disposition: A | Discharge status: Home / Self Care | Payer: Medicaid Other | Attending: Emergency Medicine | Admitting: Emergency Medicine

## 2016-03-12 DIAGNOSIS — Z09 Encounter for follow-up examination after completed treatment for conditions other than malignant neoplasm: Secondary | ICD-10-CM | POA: Diagnosis present

## 2016-03-12 NOTE — ED Notes (Signed)
Pt told registration she needed to leave

## 2016-03-12 NOTE — ED Notes (Signed)
States she has no complaints. She needs a note stating she is ok to return to work.

## 2016-10-11 ENCOUNTER — Emergency Department (HOSPITAL_BASED_OUTPATIENT_CLINIC_OR_DEPARTMENT_OTHER)
Admission: EM | Admit: 2016-10-11 | Discharge: 2016-10-11 | Disposition: A | Discharge status: Home / Self Care | Payer: Medicaid Other | Attending: Emergency Medicine | Admitting: Emergency Medicine

## 2016-10-11 ENCOUNTER — Encounter (HOSPITAL_BASED_OUTPATIENT_CLINIC_OR_DEPARTMENT_OTHER): Payer: Self-pay | Admitting: Emergency Medicine

## 2016-10-11 DIAGNOSIS — L299 Pruritus, unspecified: Secondary | ICD-10-CM | POA: Diagnosis not present

## 2016-10-11 DIAGNOSIS — Z87891 Personal history of nicotine dependence: Secondary | ICD-10-CM | POA: Diagnosis not present

## 2016-10-11 MED ORDER — FAMOTIDINE 20 MG PO TABS
20.0000 mg | ORAL_TABLET | Freq: Once | ORAL | Status: AC
  Administered 2016-10-11: 20 mg via ORAL
  Filled 2016-10-11: qty 1

## 2016-10-11 MED ORDER — PREDNISONE 50 MG PO TABS
60.0000 mg | ORAL_TABLET | Freq: Once | ORAL | Status: AC
  Administered 2016-10-11: 60 mg via ORAL
  Filled 2016-10-11: qty 1

## 2016-10-11 MED ORDER — PREDNISONE 50 MG PO TABS
ORAL_TABLET | ORAL | 0 refills | Status: DC
Start: 2016-10-11 — End: 2017-08-19

## 2016-10-11 MED ORDER — DIPHENHYDRAMINE HCL 25 MG PO CAPS
50.0000 mg | ORAL_CAPSULE | Freq: Once | ORAL | Status: AC
  Administered 2016-10-11: 50 mg via ORAL
  Filled 2016-10-11: qty 2

## 2016-10-11 MED ORDER — FAMOTIDINE 20 MG PO TABS: 20.0000 mg | ORAL_TABLET | Freq: Two times a day (BID) | ORAL | 0 refills | Status: DC | PRN

## 2016-10-11 NOTE — Discharge Instructions (Addendum)
1 to 2 tablets of 25 mg Benadryl pills every 4-6 hours as needed to a maximum of 300 mg per day. In addition, you may apply a topical hydrocortisone ointment to all affected areas except for the face.  ° °Do not hesitate to call 911 or return to the emergency room if you develop any shortness of breath, wheezing, tongue or lip swelling. ° °Do not hesitate to return to the emergency room for any new, worsening or concerning symptoms. ° °Please obtain primary care using resource guide below. Let them know that you were seen in the emergency room and that they will need to obtain records for further outpatient management. ° ° ° °

## 2016-10-11 NOTE — ED Provider Notes (Signed)
[facility]   CSN: [id number] Arrival date & time: 10/11/16  1408     History   Chief Complaint Chief Complaint  Patient presents with  . Pruritis     HPI   Blood pressure 125/76, pulse 117, temperature 98 F (36.7 C), temperature source Oral, resp. rate 18, height 5\' 3"  (1.6 m), weight 63.5 kg, last menstrual period 10/08/2016, SpO2 100 %, unknown if currently breastfeeding.  Sandra French is a 25 y.o. female complaining of Pruritus acute in onset at 98 PM today. Patient works as a Animator, she was processing brussels sprouts, should never handle brussels sprouts before. She also started having a new detergents. She denies rash, nausea, vomiting, shortness of breath. Prior allergic reaction.    History reviewed. No pertinent past medical history.  There are no active problems to display for this patient.   History reviewed. No pertinent surgical history.  OB History    Gravida Para Term Preterm AB Living   2             SAB TAB Ectopic Multiple Live Births                   Home Medications    Prior to Admission medications   Medication Sig Start Date End Date Taking? Authorizing Provider  Diclofenac Sodium CR 100 MG 24 hr tablet Take 1 tablet (100 mg total) by mouth daily. 01/04/16   April Palumbo, MD  famotidine (PEPCID) 20 MG tablet Take 1 tablet (20 mg total) by mouth 2 (two) times daily as needed (itching). 10/11/16   Emeli Goguen, PA-C  lidocaine (LIDODERM) 5 % Place 1 patch onto the skin daily. Remove & Discard patch within 12 hours or as directed by MD 01/04/16   April Palumbo, MD  methocarbamol (ROBAXIN) 500 MG tablet Take 1 tablet (500 mg total) by mouth 2 (two) times daily. 01/04/16   April Palumbo, MD  predniSONE (DELTASONE) 50 MG tablet Take 1 tablet daily with breakfast 10/11/16   Wynetta Emery, PA-C    Family History History reviewed. No pertinent family history.  Social History Social History  Substance  Use Topics  . Smoking status: Former Smoker    Packs/day: 0.50  . Smokeless tobacco: Never Used  . Alcohol use Yes     Comment: occ     Allergies   Penicillins   Review of Systems Review of Systems  10 systems reviewed and found to be negative, except as noted in the HPI.   Physical Exam Updated Vital Signs BP 125/76 (BP Location: Left Arm)   Pulse 117   Temp 98 F (36.7 C) (Oral)   Resp 18   Ht 5\' 3"  (1.6 m)   Wt 63.5 kg   LMP 10/08/2016   SpO2 100%   BMI 24.80 kg/m   Physical Exam  Constitutional: She is oriented to person, place, and time. She appears well-developed and well-nourished. No distress.  HENT:  Head: Normocephalic and atraumatic.  Mouth/Throat: Oropharynx is clear and moist.  Eyes: Conjunctivae and EOM are normal. Pupils are equal, round, and reactive to light. No scleral icterus.  Neck: Normal range of motion.  Cardiovascular: Normal rate, regular rhythm and intact distal pulses.   Pulmonary/Chest: Effort normal and breath sounds normal. No respiratory distress. She has no wheezes. She has no rales. She exhibits no tenderness.  No stridor or drooling. No posterior pharynx edema, lip or tongue swelling. Pt reclining comfortably, speaking in complete sentences.  No wheezing, excellent air movement in all fields.    Abdominal: Soft. She exhibits no distension and no mass. There is no tenderness. There is no rebound and no guarding. No hernia.  Musculoskeletal: Normal range of motion.  Neurological: She is alert and oriented to person, place, and time. She displays normal reflexes.  Skin: No rash noted. She is not diaphoretic.  Psychiatric: She has a normal mood and affect.  Nursing note and vitals reviewed.    ED Treatments / Results  Labs (all labs ordered are listed, but only abnormal results are displayed) Labs Reviewed - No data to display  EKG  EKG Interpretation None       Radiology No results found.  Procedures Procedures  (including critical care time)  Medications Ordered in ED Medications  predniSONE (DELTASONE) tablet 60 mg (60 mg Oral Given 10/11/16 1501)  famotidine (PEPCID) tablet 20 mg (20 mg Oral Given 10/11/16 1502)  diphenhydrAMINE (BENADRYL) capsule 50 mg (50 mg Oral Given 10/11/16 1502)     Initial Impression / Assessment and Plan / ED Course  I have reviewed the triage vital signs and the nursing notes.  Pertinent labs & imaging results that were available during my care of the patient were reviewed by me and considered in my medical decision making (see chart for details).  Clinical Course    Vitals:   10/11/16 1414  BP: 125/76  Pulse: 117  Resp: 18  Temp: 98 F (36.7 C)  TempSrc: Oral  SpO2: 100%  Weight: 63.5 kg  Height: 5\' 3"  (1.6 m)    Medications  predniSONE (DELTASONE) tablet 60 mg (60 mg Oral Given 10/11/16 1501)  famotidine (PEPCID) tablet 20 mg (20 mg Oral Given 10/11/16 1502)  diphenhydrAMINE (BENADRYL) capsule 50 mg (50 mg Oral Given 10/11/16 1502)    Sandra French is 25 y.o. female presenting with Diffuse pruritus onset 1 hour ago. Patient with no jaundice. No hives, lung sounds clear to auscultation, unclear if this is an allergic reaction. Will treat with allergy cocktail.  Evaluation does not show pathology that would require ongoing emergent intervention or inpatient treatment. Pt is hemodynamically stable and mentating appropriately. Discussed findings and plan with patient/guardian, who agrees with care plan. All questions answered. Return precautions discussed and outpatient follow up given.    Final Clinical Impressions(s) / ED Diagnoses   Final diagnoses:  Pruritus    New Prescriptions New Prescriptions   FAMOTIDINE (PEPCID) 20 MG TABLET    Take 1 tablet (20 mg total) by mouth 2 (two) times daily as needed (itching).   PREDNISONE (DELTASONE) 50 MG TABLET    Take 1 tablet daily with breakfast     Wynetta Emeryicole Trinidad Petron, PA-C 10/11/16 1506      Geoffery Lyonsouglas Delo, MD 10/12/16 [phone]

## 2016-10-11 NOTE — ED Triage Notes (Signed)
Patient states that she has had full body itching since about 2 pm today. The patient also has nasal congestion and states that she has some burning to her throat

## 2017-08-19 ENCOUNTER — Encounter (HOSPITAL_BASED_OUTPATIENT_CLINIC_OR_DEPARTMENT_OTHER): Payer: Self-pay | Admitting: *Deleted

## 2017-08-19 DIAGNOSIS — M545 Low back pain: Secondary | ICD-10-CM | POA: Insufficient documentation

## 2017-08-19 DIAGNOSIS — R1030 Lower abdominal pain, unspecified: Secondary | ICD-10-CM | POA: Insufficient documentation

## 2017-08-19 DIAGNOSIS — M549 Dorsalgia, unspecified: Secondary | ICD-10-CM | POA: Diagnosis present

## 2017-08-19 DIAGNOSIS — Z87891 Personal history of nicotine dependence: Secondary | ICD-10-CM | POA: Insufficient documentation

## 2017-08-19 NOTE — ED Triage Notes (Signed)
Pt presents with low back pain after riding a mechanical bull x 3 days ago.

## 2017-08-20 ENCOUNTER — Emergency Department (HOSPITAL_BASED_OUTPATIENT_CLINIC_OR_DEPARTMENT_OTHER)
Admission: EM | Admit: 2017-08-20 | Discharge: 2017-08-20 | Disposition: A | Discharge status: Home / Self Care | Payer: Medicaid Other | Attending: Emergency Medicine | Admitting: Emergency Medicine

## 2017-08-20 DIAGNOSIS — M545 Low back pain, unspecified: Secondary | ICD-10-CM

## 2017-08-20 DIAGNOSIS — R1030 Lower abdominal pain, unspecified: Secondary | ICD-10-CM

## 2017-08-20 MED ORDER — IBUPROFEN 800 MG PO TABS
800.0000 mg | ORAL_TABLET | Freq: Once | ORAL | Status: AC
  Administered 2017-08-20: 800 mg via ORAL
  Filled 2017-08-20: qty 1

## 2017-08-20 MED ORDER — IBUPROFEN 600 MG PO TABS: 600.0000 mg | ORAL_TABLET | Freq: Four times a day (QID) | ORAL | 0 refills | Status: DC | PRN

## 2017-08-20 NOTE — ED Provider Notes (Signed)
[facility]   CSN: [id number] Arrival date & time: 08/19/17  2312     History   Chief Complaint Chief Complaint  Patient presents with  . Back Pain    HPI Sandra French is a 26 y.o. female.  HPI  This a 26 year old female who presents with back pain. Patient reports that she was riding a mechanical bull on Sunday night. Since that time she has had progressive lower back pain and bilateral groin pain. She has taken aspirin with minimal relief. It is worse when she walks. Current pain is only 4 out of 10. She denies any weakness, numbness, tingling of the lower extremities. She denies any bowel or bladder difficulty. She states "I just came for my ibuprofen prescription."   History reviewed. No pertinent past medical history.  There are no active problems to display for this patient.   History reviewed. No pertinent surgical history.  OB History    Gravida Para Term Preterm AB Living   2             SAB TAB Ectopic Multiple Live Births                   Home Medications    Prior to Admission medications   Medication Sig Start Date End Date Taking? Authorizing Provider  ibuprofen (ADVIL,MOTRIN) 600 MG tablet Take 1 tablet (600 mg total) by mouth every 6 (six) hours as needed. 08/20/17   Srishti Strnad, Mayer Maskerourtney F, MD    Family History History reviewed. No pertinent family history.  Social History Social History  Substance Use Topics  . Smoking status: Former Smoker    Packs/day: 0.50  . Smokeless tobacco: Never Used  . Alcohol use Yes     Comment: occ     Allergies   Penicillins   Review of Systems Review of Systems  Genitourinary: Negative for difficulty urinating and dysuria.  Musculoskeletal: Positive for back pain.  Neurological: Negative for weakness and numbness.  All other systems reviewed and are negative.    Physical Exam Updated Vital Signs BP 139/87 (BP Location: Right Arm)   Pulse 98   Temp 99.2 F (37.3 C)  (Oral)   Resp 16   Ht 5\' 3"  (1.6 m)   Wt 61.2 kg (135 lb)   LMP 08/05/2017 (Exact Date)   SpO2 100%   BMI 23.91 kg/m   Physical Exam  Constitutional: She is oriented to person, place, and time. She appears well-developed and well-nourished. No distress.  HENT:  Head: Normocephalic and atraumatic.  Cardiovascular: Normal rate, regular rhythm and normal heart sounds.   Pulmonary/Chest: Effort normal. No respiratory distress. She has no wheezes.  Abdominal: Soft. There is no tenderness.  Musculoskeletal:  TTP bilateral paraspinous muscle region of the lower lumbar spine, no midline tenderness, step-off, deformity, negative straight leg raise  Neurological: She is alert and oriented to person, place, and time.  5 out of 5 strength bilateral lower extremities, normal reflexes, no clonus  Skin: Skin is warm and dry.  Psychiatric: She has a normal mood and affect.  Nursing note and vitals reviewed.    ED Treatments / Results  Labs (all labs ordered are listed, but only abnormal results are displayed) Labs Reviewed - No data to display  EKG  EKG Interpretation None       Radiology No results found.  Procedures Procedures (including critical care time)  Medications Ordered in ED Medications  ibuprofen (ADVIL,MOTRIN) tablet 800 mg (not  administered)     Initial Impression / Assessment and Plan / ED Course  I have reviewed the triage vital signs and the nursing notes.  Pertinent labs & imaging results that were available during my care of the patient were reviewed by me and considered in my medical decision making (see chart for details).     Patient presents with back pain after riding a bull. No signs or symptoms of cauda equina. Suspect musculoskeletal etiology. Patient given ibuprofen and will be discharged with prescription.  After history, exam, and medical workup I feel the patient has been appropriately medically screened and is safe for discharge home.  Pertinent diagnoses were discussed with the patient. Patient was given return precautions.   Final Clinical Impressions(s) / ED Diagnoses   Final diagnoses:  Acute bilateral low back pain without sciatica  Inguinal pain, unspecified laterality    New Prescriptions New Prescriptions   IBUPROFEN (ADVIL,MOTRIN) 600 MG TABLET    Take 1 tablet (600 mg total) by mouth every 6 (six) hours as needed.     Shon Baton, MD 08/20/17 [phone]

## 2017-08-20 NOTE — ED Notes (Signed)
Riding a mechanical bull on Sunday having back pain  otc meds not helping  Left work early tonight due to back pain

## 2017-08-20 NOTE — Discharge Instructions (Signed)
You were seen today for back pain after riding a bull. This is likely musculoskeletal. Take ibuprofen as needed for pain. If you develop any new or worsening symptoms including difficulty, peeing or pooping, weakness of the legs, numbness you should be reevaluated.

## 2017-08-20 NOTE — ED Notes (Signed)
Pt requesting ibuprofen and a note for work

## 2017-11-16 ENCOUNTER — Encounter (HOSPITAL_BASED_OUTPATIENT_CLINIC_OR_DEPARTMENT_OTHER): Payer: Self-pay | Admitting: *Deleted

## 2017-11-16 ENCOUNTER — Emergency Department (HOSPITAL_BASED_OUTPATIENT_CLINIC_OR_DEPARTMENT_OTHER)
Admission: EM | Admit: 2017-11-16 | Discharge: 2017-11-16 | Disposition: A | Discharge status: Home / Self Care | Payer: Medicaid Other | Attending: Emergency Medicine | Admitting: Emergency Medicine

## 2017-11-16 ENCOUNTER — Other Ambulatory Visit: Payer: Self-pay

## 2017-11-16 DIAGNOSIS — R509 Fever, unspecified: Secondary | ICD-10-CM | POA: Insufficient documentation

## 2017-11-16 DIAGNOSIS — Z87891 Personal history of nicotine dependence: Secondary | ICD-10-CM | POA: Insufficient documentation

## 2017-11-16 DIAGNOSIS — R05 Cough: Secondary | ICD-10-CM | POA: Insufficient documentation

## 2017-11-16 DIAGNOSIS — J029 Acute pharyngitis, unspecified: Secondary | ICD-10-CM | POA: Insufficient documentation

## 2017-11-16 DIAGNOSIS — R0981 Nasal congestion: Secondary | ICD-10-CM | POA: Insufficient documentation

## 2017-11-16 LAB — RAPID STREP SCREEN (MED CTR MEBANE ONLY): Streptococcus, Group A Screen (Direct): NEGATIVE

## 2017-11-16 MED ORDER — DEXAMETHASONE 6 MG PO TABS
10.0000 mg | ORAL_TABLET | Freq: Once | ORAL | Status: AC
  Administered 2017-11-16: 10 mg via ORAL
  Filled 2017-11-16: qty 1

## 2017-11-16 NOTE — ED Provider Notes (Signed)
[facility] EMERGENCY DEPARTMENT Provider Note   CSN: [id number] Arrival date & time: 11/16/17  [id number]     History   Chief Complaint Chief Complaint  Patient presents with  . Sore Throat    HPI Sandra French is a 26 y.o. female.  26 year old female who presents with sore throat.  The patient has had 1 week of constant sore throat, noticed some white spots on her tonsils last night in the mirror.  She had fever and chills 2 days ago for which she took TheraFlu.  She reports associated cough and nasal congestion.  Daughter has recently been sick as well and attends daycare.  No medications today prior to arrival.  Pain with swallowing but she is able to swallow and she denies any breathing problems.   The history is provided by the patient.    History reviewed. No pertinent past medical history.  There are no active problems to display for this patient.   History reviewed. No pertinent surgical history.  OB History    Gravida Para Term Preterm AB Living   2             SAB TAB Ectopic Multiple Live Births                   Home Medications    Prior to Admission medications   Medication Sig Start Date End Date Taking? Authorizing Provider  ibuprofen (ADVIL,MOTRIN) 600 MG tablet Take 1 tablet (600 mg total) by mouth every 6 (six) hours as needed. 08/20/17  Yes Horton, Mayer Maskerourtney F, MD    Family History No family history on file.  Social History Social History   Tobacco Use  . Smoking status: Former Smoker    Packs/day: 0.50  . Smokeless tobacco: Never Used  Substance Use Topics  . Alcohol use: Yes    Comment: occ  . Drug use: No     Allergies   Penicillins   Review of Systems Review of Systems All other systems reviewed and are negative except that which was mentioned in HPI   Physical Exam Updated Vital Signs BP 133/86 (BP Location: Right Arm)   Pulse 88   Temp 99.5 F (37.5 C) (Oral)   Resp 18   Ht 5\' 3"  (1.6 m)   Wt 72.6 kg (160  lb)   SpO2 100%   BMI 28.34 kg/m   Physical Exam  Constitutional: She is oriented to person, place, and time. She appears well-developed and well-nourished. No distress.  HENT:  Head: Normocephalic and atraumatic.  Mouth/Throat: Uvula is midline. No uvula swelling.  Moist mucous membranes Tonsils mildly enlarged R>L but no uvular deviation  Very small white spots on L tonsil Tolerating secretions + nasal congestion  Eyes: Conjunctivae are normal. Pupils are equal, round, and reactive to light.  Neck: Neck supple.  Cardiovascular: Normal rate, regular rhythm and normal heart sounds.  No murmur heard. Pulmonary/Chest: Effort normal and breath sounds normal.  Abdominal: Soft. Bowel sounds are normal. She exhibits no distension. There is no tenderness.  Musculoskeletal: She exhibits no edema.  Lymphadenopathy:    She has cervical adenopathy.  Neurological: She is alert and oriented to person, place, and time.  Fluent speech  Skin: Skin is warm and dry.  Psychiatric: She has a normal mood and affect. Judgment normal.  Nursing note and vitals reviewed.    ED Treatments / Results  Labs (all labs ordered are listed, but only abnormal results are displayed) Labs Reviewed  RAPID STREP SCREEN (NOT AT [facility])    EKG  EKG Interpretation None       Radiology No results found.  Procedures Procedures (including critical care time)  Medications Ordered in ED Medications - No data to display   Initial Impression / Assessment and Plan / ED Course  I have reviewed the triage vital signs and the nursing notes.  Pertinent labs that were available during my care of the patient were reviewed by me and considered in my medical decision making (see chart for details).     VS reassuring, tolerating secretions, breathing comfortably. No evidence of PTA or RPA on exam.  But strep negative and given associated symptoms I suspect viral pharyngitis.  Have discussed supportive measures  for symptoms and extensively reviewed return precautions including difficulty swallowing, severe unilateral tonsillar swelling, breathing problems, or other new concerns.  Patient voiced understanding and was discharged in satisfactory condition. Final Clinical Impressions(s) / ED Diagnoses   Final diagnoses:  Viral pharyngitis    ED Discharge Orders    None       Davlin, Ambrose Finlandachel Morgan, MD 11/16/17 [phone]

## 2017-11-16 NOTE — ED Triage Notes (Signed)
Patient states she has a one week history of sore throat.  States two days ago she had fever and chills.

## 2017-11-19 LAB — CULTURE, GROUP A STREP (THRC)

## 2017-11-20 ENCOUNTER — Other Ambulatory Visit: Payer: Self-pay

## 2017-11-20 ENCOUNTER — Emergency Department (HOSPITAL_BASED_OUTPATIENT_CLINIC_OR_DEPARTMENT_OTHER)
Admission: EM | Admit: 2017-11-20 | Discharge: 2017-11-20 | Disposition: A | Discharge status: Home / Self Care | Payer: Self-pay | Attending: Emergency Medicine | Admitting: Emergency Medicine

## 2017-11-20 ENCOUNTER — Encounter (HOSPITAL_BASED_OUTPATIENT_CLINIC_OR_DEPARTMENT_OTHER): Payer: Self-pay | Admitting: *Deleted

## 2017-11-20 DIAGNOSIS — Z87891 Personal history of nicotine dependence: Secondary | ICD-10-CM | POA: Insufficient documentation

## 2017-11-20 DIAGNOSIS — J069 Acute upper respiratory infection, unspecified: Secondary | ICD-10-CM | POA: Insufficient documentation

## 2017-11-20 DIAGNOSIS — Z791 Long term (current) use of non-steroidal anti-inflammatories (NSAID): Secondary | ICD-10-CM | POA: Insufficient documentation

## 2017-11-20 DIAGNOSIS — R05 Cough: Secondary | ICD-10-CM | POA: Insufficient documentation

## 2017-11-20 MED ORDER — IBUPROFEN 400 MG PO TABS
600.0000 mg | ORAL_TABLET | Freq: Once | ORAL | Status: AC
  Administered 2017-11-20: 600 mg via ORAL
  Filled 2017-11-20: qty 1

## 2017-11-20 MED ORDER — DEXAMETHASONE SODIUM PHOSPHATE 10 MG/ML IJ SOLN
8.0000 mg | Freq: Once | INTRAMUSCULAR | Status: AC
  Administered 2017-11-20: 8 mg via INTRAMUSCULAR
  Filled 2017-11-20: qty 1

## 2017-11-20 NOTE — ED Notes (Signed)
Pt. Called for her Strep Culture Results.  Results reviewed with pt.

## 2017-11-20 NOTE — ED Triage Notes (Signed)
Pt seen here Monday for same  Strep neg, c/o sore throat x 7 days

## 2017-11-20 NOTE — ED Provider Notes (Signed)
[facility] EMERGENCY DEPARTMENT Provider Note   CSN: [id number] Arrival date & time: 11/20/17  1400     History   Chief Complaint Chief Complaint  Patient presents with  . Sore Throat    HPI Sandra French is a 26 y.o. female presenting to ED for subsequent visit for sore throat. Pt was seen on Monday for similar complaint, diagnosed with viral pharyngitis, with neg step swab and neg culture. Patient states she has been treating her symptoms at home with aspirin intermittently, pseudoephed, and theraflu. Reports assoc nasal congestion and dry cough. Denies difficulty breathing or swallowing, fever, or new or concerning symptoms.  The history is provided by the patient.    History reviewed. No pertinent past medical history.  There are no active problems to display for this patient.   History reviewed. No pertinent surgical history.  OB History    Gravida Para Term Preterm AB Living   2             SAB TAB Ectopic Multiple Live Births                   Home Medications    Prior to Admission medications   Medication Sig Start Date End Date Taking? Authorizing Provider  ibuprofen (ADVIL,MOTRIN) 600 MG tablet Take 1 tablet (600 mg total) by mouth every 6 (six) hours as needed. 08/20/17   Horton, Mayer Maskerourtney F, MD    Family History History reviewed. No pertinent family history.  Social History Social History   Tobacco Use  . Smoking status: Former Smoker    Packs/day: 0.50  . Smokeless tobacco: Never Used  Substance Use Topics  . Alcohol use: Yes    Comment: occ  . Drug use: No     Allergies   Penicillins   Review of Systems Review of Systems  Constitutional: Negative for fever.  HENT: Positive for congestion and sore throat. Negative for ear pain, trouble swallowing and voice change.   Respiratory: Positive for cough. Negative for shortness of breath.      Physical Exam Updated Vital Signs BP (!) 136/95 (BP Location: Right Arm)   Pulse  83   Temp 98 F (36.7 C) (Oral)   Resp 18   Ht 5\' 3"  (1.6 m)   Wt 72.6 kg (160 lb)   SpO2 100%   BMI 28.34 kg/m   Physical Exam  Constitutional: She appears well-developed and well-nourished.  Non-toxic appearance. She does not appear ill.  Tolerating secretions. No drooling or tripoding.  HENT:  Head: Normocephalic and atraumatic.  Right Ear: Hearing, tympanic membrane, external ear and ear canal normal.  Left Ear: Hearing, tympanic membrane, external ear and ear canal normal.  Mouth/Throat: Uvula is midline. No trismus in the jaw. No uvula swelling. Posterior oropharyngeal erythema present. No oropharyngeal exudate. No tonsillar exudate.  Bilateral tonsillar edema, equal bilaterally, no exudate. Soft palate elevates symmetrically. No evidence of PTA  Eyes: Conjunctivae are normal.  Neck: Normal range of motion. Neck supple. No JVD present. No tracheal deviation present.  Cardiovascular: Normal rate, regular rhythm, normal heart sounds and intact distal pulses.  Pulmonary/Chest: Effort normal and breath sounds normal. No stridor. No respiratory distress. She has no wheezes. She has no rales.  Lymphadenopathy:    She has cervical adenopathy (mild anterior).  Psychiatric: She has a normal mood and affect. Her behavior is normal.  Nursing note and vitals reviewed.    ED Treatments / Results  Labs (all labs ordered  are listed, but only abnormal results are displayed) Labs Reviewed - No data to display  EKG  EKG Interpretation None       Radiology No results found.  Procedures Procedures (including critical care time)  Medications Ordered in ED Medications  dexamethasone (DECADRON) injection 8 mg (8 mg Intramuscular Given 11/20/17 1544)  ibuprofen (ADVIL,MOTRIN) tablet 600 mg (600 mg Oral Given 11/20/17 1546)     Initial Impression / Assessment and Plan / ED Course  I have reviewed the triage vital signs and the nursing notes.  Pertinent labs & imaging results that  were available during my care of the patient were reviewed by me and considered in my medical decision making (see chart for details).     Pt presenting for subsequent visit for sore throat and URI symptoms. Pt is afebrile, tolerating secretions. No tonsillar exudate on exam. Presents with mild cervical lymphadenopathy & dysphagia; exam still consistent with viral pharyngitis vs viral URI. No abx indicated. DC w symptomatic tx for pain  Pt does not appear dehydrated, but did discuss importance of water rehydration. Presentation non concerning for PTA or infxn spread to soft tissue. No trismus or uvula deviation. Specific return precautions discussed. Pt able to drink water in ED without difficulty with intact air way. Recommended PCP follow up.  Discussed results, findings, treatment and follow up. Patient advised of return precautions. Patient verbalized understanding and agreed with plan.  Final Clinical Impressions(s) / ED Diagnoses   Final diagnoses:  Viral URI    ED Discharge Orders    None       Robinson, Swazil[place]Jordan N, PA-C 11/20/17 1547    Nira Connardama, Pedro Eduardo, MD 11/21/17 [phone]

## 2017-11-20 NOTE — Discharge Instructions (Signed)
Please read instructions below. You can take ibuprofen every 6 hours as needed for sore throat or body aches. Drink plenty of water. Follow up with your primary care provider as needed. Return to the ER for difficulty swallowing liquids, difficulty breathing, or new or worsening symptoms.

## 2017-11-26 ENCOUNTER — Encounter (HOSPITAL_BASED_OUTPATIENT_CLINIC_OR_DEPARTMENT_OTHER): Payer: Self-pay | Admitting: *Deleted

## 2017-11-26 ENCOUNTER — Emergency Department (HOSPITAL_BASED_OUTPATIENT_CLINIC_OR_DEPARTMENT_OTHER)
Admission: EM | Admit: 2017-11-26 | Discharge: 2017-11-26 | Disposition: A | Discharge status: Home / Self Care | Payer: Self-pay | Attending: Emergency Medicine | Admitting: Emergency Medicine

## 2017-11-26 ENCOUNTER — Other Ambulatory Visit: Payer: Self-pay

## 2017-11-26 ENCOUNTER — Emergency Department (HOSPITAL_BASED_OUTPATIENT_CLINIC_OR_DEPARTMENT_OTHER)
Admission: EM | Admit: 2017-11-26 | Discharge: 2017-11-27 | Disposition: A | Discharge status: Home / Self Care | Payer: Self-pay | Attending: Emergency Medicine | Admitting: Emergency Medicine

## 2017-11-26 DIAGNOSIS — Z5321 Procedure and treatment not carried out due to patient leaving prior to being seen by health care provider: Secondary | ICD-10-CM | POA: Insufficient documentation

## 2017-11-26 DIAGNOSIS — Z87891 Personal history of nicotine dependence: Secondary | ICD-10-CM | POA: Insufficient documentation

## 2017-11-26 DIAGNOSIS — J029 Acute pharyngitis, unspecified: Secondary | ICD-10-CM | POA: Insufficient documentation

## 2017-11-26 NOTE — ED Notes (Signed)
Called x2, no answer 

## 2017-11-26 NOTE — ED Triage Notes (Signed)
Sore throat x >1 week. Has been seen x2 for same without relief. Denies fever.

## 2017-11-26 NOTE — ED Triage Notes (Signed)
sore throat x >1 week.  seen multiple times for same. Noted to be on phone during entire triage. Denies fever

## 2017-11-26 NOTE — ED Notes (Signed)
Called x1, no answer

## 2017-11-27 LAB — CBC WITH DIFFERENTIAL/PLATELET
BASOS PCT: 0 %
Basophils Absolute: 0 10*3/uL (ref 0.0–0.1)
Eosinophils Absolute: 0.1 10*3/uL (ref 0.0–0.7)
Eosinophils Relative: 1 %
HCT: 39.5 % (ref 36.0–46.0)
Hemoglobin: 13.2 g/dL (ref 12.0–15.0)
LYMPHS ABS: 3.5 10*3/uL (ref 0.7–4.0)
Lymphocytes Relative: 32 %
MCH: 29.7 pg (ref 26.0–34.0)
MCHC: 33.4 g/dL (ref 30.0–36.0)
MCV: 89 fL (ref 78.0–100.0)
MONOS PCT: 7 %
Monocytes Absolute: 0.8 10*3/uL (ref 0.1–1.0)
NEUTROS ABS: 6.6 10*3/uL (ref 1.7–7.7)
Neutrophils Relative %: 60 %
Platelets: 243 10*3/uL (ref 150–400)
RBC: 4.44 MIL/uL (ref 3.87–5.11)
RDW: 13.5 % (ref 11.5–15.5)
WBC: 11 10*3/uL — ABNORMAL HIGH (ref 4.0–10.5)

## 2017-11-27 LAB — RAPID STREP SCREEN (MED CTR MEBANE ONLY): STREPTOCOCCUS, GROUP A SCREEN (DIRECT): NEGATIVE

## 2017-11-27 LAB — MONONUCLEOSIS SCREEN: Mono Screen: NEGATIVE

## 2017-11-27 MED ORDER — DEXAMETHASONE SODIUM PHOSPHATE 10 MG/ML IJ SOLN
10.0000 mg | Freq: Once | INTRAMUSCULAR | Status: AC
  Administered 2017-11-27: 10 mg via INTRAMUSCULAR
  Filled 2017-11-27: qty 1

## 2017-11-27 NOTE — ED Notes (Signed)
Pt has not followed up, she has not tried anything for her throat since at least two days ago.  She states she is here "for the shot."  Apparently, the steroid shot helped her to feel better the last time.  Advised patient that she needs to see a PCP, pt states there is "a hiccup" in her insurance.  Advised that she can also go to urgent care, [facility], and that [facility].  Pt does not have a fever, does not have difficulty swallowing, is on phone and watching TV.  Pt denies any needs at this time, and is advised of the wait.

## 2017-11-27 NOTE — ED Provider Notes (Addendum)
[facility] Provider Note: Lowella DellJ. Lane Deane Melick, MD, FACEP  CSN: [id number] MRN: [id number] ARRIVAL: 11/26/17 at 2321 ROOM: MH01/MH01   CHIEF COMPLAINT  Sore Throat   HISTORY OF PRESENT ILLNESS  11/27/17 1:33 AM Sandra French is a 26 y.o. female with about a 2-week history of sore throat.  She has been seen here twice for this.  On December 2 strep test was negative she was diagnosed with a viral pharyngitis.  She was given ibuprofen.  She was seen again on the sixth when she was given a shot of dexamethasone.  She states this gave her about 1 day of transient relief.  She states her symptoms are worsening and she now rates her pain as an 8 out of 10.  Pain is worse with swallowing and she feels like her throat is swollen.  She denies nasal congestion or rhinorrhea but has been coughing up phlegm.  She has started having fevers but cannot quantify them.  She also complains of general malaise, body aches and headaches but denies fatigue or abdominal pain.  She has had diarrhea but no nausea or vomiting.  She has been using over-the-counter analgesics and sore throat remedies without relief.   History reviewed. No pertinent past medical history.  History reviewed. No pertinent surgical history.  History reviewed. No pertinent family history.  Social History   Tobacco Use  . Smoking status: Former Smoker    Packs/day: 0.50  . Smokeless tobacco: Never Used  Substance Use Topics  . Alcohol use: Yes    Comment: occ  . Drug use: No    Prior to Admission medications   Medication Sig Start Date End Date Taking? Authorizing Provider  ibuprofen (ADVIL,MOTRIN) 600 MG tablet Take 1 tablet (600 mg total) by mouth every 6 (six) hours as needed. 08/20/17   Horton, Mayer Maskerourtney F, MD    Allergies Penicillins   REVIEW OF SYSTEMS  Negative except as noted here or in the History of Present Illness.   PHYSICAL EXAMINATION  Initial Vital Signs Blood pressure (!) 134/91, pulse 98,  temperature 98.3 F (36.8 C), temperature source Oral, resp. rate 18, height 5\' 3"  (1.6 m), weight 72.6 kg (160 lb), SpO2 100 %, unknown if currently breastfeeding.  Examination General: Well-developed, well-nourished female in no acute distress; appearance consistent with age of record HENT: normocephalic; atraumatic; no pharyngeal erythema or exudate; no dysphonia; uvula midline; no trismus; TMs normal Eyes: pupils equal, round and reactive to light; extraocular muscles intact Neck: supple Heart: regular rate and rhythm Lungs: clear to auscultation bilaterally Abdomen: soft; nondistended; nontender; no masses or hepatosplenomegaly; bowel sounds present Extremities: No deformity; full range of motion; pulses normal Neurologic: Awake, alert and oriented; motor function intact in all extremities and symmetric; no facial droop Skin: Warm and dry Psychiatric: Normal mood and affect   RESULTS  Summary of this visit's results, reviewed by myself:   EKG Interpretation  Date/Time:    Ventricular Rate:    PR Interval:    QRS Duration:   QT Interval:    QTC Calculation:   R Axis:     Text Interpretation:        Laboratory Studies: Results for orders placed or performed during the hospital encounter of 11/26/17 (from the past 24 hour(s))  Rapid strep screen     Status: None   Collection Time: 11/27/17  1:36 AM  Result Value Ref Range   Streptococcus, Group A Screen (Direct) NEGATIVE NEGATIVE  Mononucleosis screen  Status: None   Collection Time: 11/27/17  1:41 AM  Result Value Ref Range   Mono Screen NEGATIVE NEGATIVE  CBC with Differential/Platelet     Status: Abnormal   Collection Time: 11/27/17  1:41 AM  Result Value Ref Range   WBC 11.0 (H) 4.0 - 10.5 K/uL   RBC 4.44 3.87 - 5.11 MIL/uL   Hemoglobin 13.2 12.0 - 15.0 g/dL   HCT 16.139.5 09.636.0 - 04.546.0 %   MCV 89.0 78.0 - 100.0 fL   MCH 29.7 26.0 - 34.0 pg   MCHC 33.4 30.0 - 36.0 g/dL   RDW 40.913.5 81.111.5 - 91.415.5 %   Platelets 243  150 - 400 K/uL   Neutrophils Relative % 60 %   Lymphocytes Relative 32 %   Monocytes Relative 7 %   Eosinophils Relative 1 %   Basophils Relative 0 %   Neutro Abs 6.6 1.7 - 7.7 K/uL   Lymphs Abs 3.5 0.7 - 4.0 K/uL   Monocytes Absolute 0.8 0.1 - 1.0 K/uL   Eosinophils Absolute 0.1 0.0 - 0.7 K/uL   Basophils Absolute 0.0 0.0 - 0.1 K/uL   WBC Morphology WHITE COUNT CONFIRMED ON SMEAR    Smear Review PLATELET COUNT CONFIRMED BY SMEAR    Imaging Studies: No results found.  ED COURSE  Nursing notes and initial vitals signs, including pulse oximetry, reviewed.  Vitals:   11/26/17 2328 11/26/17 2329  BP: (!) 134/91   Pulse: 98   Resp: 18   Temp: 98.3 F (36.8 C)   TempSrc: Oral   SpO2: 100%   Weight:  72.6 kg (160 lb)  Height:  5\' 3"  (1.6 m)   Patient advised of negative strep and mono tests.  This may represent a prolonged viral pharyngitis.  It could also represent GERD although her symptomatology suggests against this.  She is requesting a second dose of dexamethasone and we will try this.  PROCEDURES    ED DIAGNOSES     ICD-10-CM   1. Sore throat J02.9        Maryuri Warnke, Jonny RuizJohn, MD 11/27/17 0235    Paula LibraMolpus, Kimaria Struthers, MD 11/27/17 [phone]

## 2017-11-28 LAB — CULTURE, GROUP A STREP (THRC)

## 2018-03-29 ENCOUNTER — Other Ambulatory Visit: Payer: Self-pay

## 2018-03-29 ENCOUNTER — Emergency Department (HOSPITAL_BASED_OUTPATIENT_CLINIC_OR_DEPARTMENT_OTHER)
Admission: EM | Admit: 2018-03-29 | Discharge: 2018-03-29 | Disposition: A | Discharge status: Home / Self Care | Payer: Self-pay | Attending: Emergency Medicine | Admitting: Emergency Medicine

## 2018-03-29 ENCOUNTER — Encounter (HOSPITAL_BASED_OUTPATIENT_CLINIC_OR_DEPARTMENT_OTHER): Payer: Self-pay | Admitting: *Deleted

## 2018-03-29 DIAGNOSIS — N760 Acute vaginitis: Secondary | ICD-10-CM | POA: Insufficient documentation

## 2018-03-29 DIAGNOSIS — A599 Trichomoniasis, unspecified: Secondary | ICD-10-CM | POA: Insufficient documentation

## 2018-03-29 DIAGNOSIS — B9689 Other specified bacterial agents as the cause of diseases classified elsewhere: Secondary | ICD-10-CM | POA: Insufficient documentation

## 2018-03-29 DIAGNOSIS — Z87891 Personal history of nicotine dependence: Secondary | ICD-10-CM | POA: Insufficient documentation

## 2018-03-29 LAB — URINALYSIS, ROUTINE W REFLEX MICROSCOPIC
Bilirubin Urine: NEGATIVE
Glucose, UA: NEGATIVE mg/dL
HGB URINE DIPSTICK: NEGATIVE
Ketones, ur: NEGATIVE mg/dL
NITRITE: NEGATIVE
Protein, ur: NEGATIVE mg/dL
SPECIFIC GRAVITY, URINE: 1.02 (ref 1.005–1.030)
pH: 7.5 (ref 5.0–8.0)

## 2018-03-29 LAB — WET PREP, GENITAL
Sperm: NONE SEEN
YEAST WET PREP: NONE SEEN

## 2018-03-29 LAB — PREGNANCY, URINE: Preg Test, Ur: NEGATIVE

## 2018-03-29 LAB — URINALYSIS, MICROSCOPIC (REFLEX): RBC / HPF: NONE SEEN RBC/hpf (ref 0–5)

## 2018-03-29 MED ORDER — METRONIDAZOLE 500 MG PO TABS: 500.0000 mg | ORAL_TABLET | Freq: Two times a day (BID) | ORAL | 0 refills | Status: DC

## 2018-03-29 NOTE — ED Provider Notes (Signed)
[facility] EMERGENCY DEPARTMENT Provider Note   CSN: [id number] Arrival date & time: 03/29/18  1603     History   Chief Complaint Chief Complaint  Patient presents with  . Vaginal Discharge    HPI Sandra French is a 27 y.o. female who presents for evaluation of white vaginal discharge that is been ongoing for the last 3 days.  She reports that she is also having vaginal itching.  She reports she is currently sexually active with 2 partners.  They do not use protection.  Patient denies any dysuria, hematuria.  Patient denies any fevers, abdominal pain.  Patient has not noted any vaginal rash.  The history is provided by the patient.    History reviewed. No pertinent past medical history.  There are no active problems to display for this patient.   History reviewed. No pertinent surgical history.   OB History    Gravida  2   Para      Term      Preterm      AB      Living        SAB      TAB      Ectopic      Multiple      Live Births               Home Medications    Prior to Admission medications   Medication Sig Start Date End Date Taking? Authorizing Provider  ibuprofen (ADVIL,MOTRIN) 600 MG tablet Take 1 tablet (600 mg total) by mouth every 6 (six) hours as needed. 08/20/17   Horton, Mayer Masker, MD  metroNIDAZOLE (FLAGYL) 500 MG tablet Take 1 tablet (500 mg total) by mouth 2 (two) times daily. 03/29/18   Maxwell Caul, PA-C    Family History History reviewed. No pertinent family history.  Social History Social History   Tobacco Use  . Smoking status: Former Smoker    Packs/day: 0.50  . Smokeless tobacco: Never Used  Substance Use Topics  . Alcohol use: Yes    Comment: occ  . Drug use: No    Types: Marijuana     Allergies   Penicillins   Review of Systems Review of Systems  Genitourinary: Positive for vaginal discharge. Negative for dysuria, hematuria and vaginal pain.     Physical Exam Updated Vital  Signs BP 119/77 (BP Location: Right Arm)   Pulse 89   Temp 98.5 F (36.9 C) (Oral)   Resp 20   Ht 5\' 3"  (1.6 m)   Wt 77.1 kg (170 lb)   SpO2 100%   BMI 30.11 kg/m   Physical Exam  Constitutional: She appears well-developed and well-nourished.  HENT:  Head: Normocephalic and atraumatic.  Eyes: Conjunctivae and EOM are normal. Right eye exhibits no discharge. Left eye exhibits no discharge. No scleral icterus.  Pulmonary/Chest: Effort normal.  Genitourinary: Uterus normal. Cervix exhibits no motion tenderness and no friability. Right adnexum displays no mass and no tenderness. Left adnexum displays no mass and no tenderness. Vaginal discharge found.  Genitourinary Comments: The exam was performed with a chaperone present. Normal external female genitalia. No lesions, rash, or sores.  Malodorous, white discharge noted in the vaginal vault.  No cervical friability.  No CMT.  No adnexal mass or tenderness bilaterally.  Neurological: She is alert.  Skin: Skin is warm and dry.  Psychiatric: She has a normal mood and affect. Her speech is normal and behavior is normal.  Nursing note  and vitals reviewed.    ED Treatments / Results  Labs (all labs ordered are listed, but only abnormal results are displayed) Labs Reviewed  WET PREP, GENITAL - Abnormal; Notable for the following components:      Result Value   Trich, Wet Prep PRESENT (*)    Clue Cells Wet Prep HPF POC PRESENT (*)    WBC, Wet Prep HPF POC MODERATE (*)    All other components within normal limits  URINALYSIS, ROUTINE W REFLEX MICROSCOPIC - Abnormal; Notable for the following components:   Leukocytes, UA SMALL (*)    All other components within normal limits  URINALYSIS, MICROSCOPIC (REFLEX) - Abnormal; Notable for the following components:   Bacteria, UA MANY (*)    Squamous Epithelial / LPF 6-30 (*)    All other components within normal limits  PREGNANCY, URINE  GC/CHLAMYDIA PROBE AMP ([place]) NOT AT [facility]     EKG None  Radiology No results found.  Procedures Procedures (including critical care time)  Medications Ordered in ED Medications - No data to display   Initial Impression / Assessment and Plan / ED Course  I have reviewed the triage vital signs and the nursing notes.  Pertinent labs & imaging results that were available during my care of the patient were reviewed by me and considered in my medical decision making (see chart for details).     27 year old female who presents for evaluation of 1 week of vaginal discharge.  Reports she is currently sexually active with 2 partners.  They do not use protection.  No dysuria, hematuria, fever, lower abdominal tenderness. Patient is afebrile, non-toxic appearing, sitting comfortably on examination table. Vital signs reviewed and stable.  Urine pregnancy is negative.  UA shows small leukocytes.  GU exam shows some white vaginal discharge.  No CMT.  Exam not concerning for PID.  Wet prep shows Trichomonas, clue cells present.  We will plan to treat with Flagyl.  Discussed results with patient.  Instructed patient not to have any sexual intercourse until she informs her partners and they are treated. Patient had ample opportunity for questions and discussion. All patient's questions were answered with full understanding. Strict return precautions discussed. Patient expresses understanding and agreement to plan.   Final Clinical Impressions(s) / ED Diagnoses   Final diagnoses:  BV (bacterial vaginosis)  Trichomoniasis    ED Discharge Orders        Ordered    metroNIDAZOLE (FLAGYL) 500 MG tablet  2 times daily     03/29/18 1839       Rosana HoesLayden, Eino Whitner A, PA-C 03/29/18 1842    Tilden Fossaees, Elizabeth, MD 03/30/18 0127

## 2018-03-29 NOTE — ED Triage Notes (Signed)
Vaginal itching, discharge x 1 week.

## 2018-03-29 NOTE — Discharge Instructions (Signed)
You have been treated today trichomonas and bacterial vaginosis.   Take Flagyl as directed.  It is very important that you do not consume any alcohol while taking this medication as it will cause you to become violently ill.  The gonorrhea and Chlamydia test results with take 2-3 days to return. If there is an abnormal result, you will be notified. If you do not hear anything, that means the results were negative. You can also log on MyChart to see the results.   Your sexual partner needs to be treated too. Do not have sexual intercourse for the next 7 days and after your partner has been treated.   Follow-up with your primary care doctor in 2-4 days. If you do not have a primary care doctor, you can use one listed in the paperwork.   Return to the Emergency Department for any fever, abdominal pain, difficulty breathing, nausea/vomiting or any other worsening or concerning symptoms.

## 2018-03-30 LAB — GC/CHLAMYDIA PROBE AMP (~~LOC~~) NOT AT ARMC
CHLAMYDIA, DNA PROBE: NEGATIVE
Neisseria Gonorrhea: NEGATIVE

## 2018-05-15 ENCOUNTER — Encounter (HOSPITAL_BASED_OUTPATIENT_CLINIC_OR_DEPARTMENT_OTHER): Payer: Self-pay

## 2018-05-15 ENCOUNTER — Other Ambulatory Visit: Payer: Self-pay

## 2018-05-15 ENCOUNTER — Emergency Department (HOSPITAL_BASED_OUTPATIENT_CLINIC_OR_DEPARTMENT_OTHER)
Admission: EM | Admit: 2018-05-15 | Discharge: 2018-05-15 | Disposition: A | Discharge status: Home / Self Care | Payer: Self-pay | Attending: Emergency Medicine | Admitting: Emergency Medicine

## 2018-05-15 DIAGNOSIS — Z87891 Personal history of nicotine dependence: Secondary | ICD-10-CM | POA: Insufficient documentation

## 2018-05-15 DIAGNOSIS — Z79899 Other long term (current) drug therapy: Secondary | ICD-10-CM | POA: Insufficient documentation

## 2018-05-15 DIAGNOSIS — B9689 Other specified bacterial agents as the cause of diseases classified elsewhere: Secondary | ICD-10-CM | POA: Insufficient documentation

## 2018-05-15 DIAGNOSIS — N760 Acute vaginitis: Secondary | ICD-10-CM | POA: Insufficient documentation

## 2018-05-15 LAB — WET PREP, GENITAL
Sperm: NONE SEEN
TRICH WET PREP: NONE SEEN
YEAST WET PREP: NONE SEEN

## 2018-05-15 LAB — URINALYSIS, MICROSCOPIC (REFLEX)

## 2018-05-15 LAB — PREGNANCY, URINE: Preg Test, Ur: NEGATIVE

## 2018-05-15 LAB — URINALYSIS, ROUTINE W REFLEX MICROSCOPIC
Bilirubin Urine: NEGATIVE
GLUCOSE, UA: NEGATIVE mg/dL
Ketones, ur: NEGATIVE mg/dL
Nitrite: NEGATIVE
PH: 6 (ref 5.0–8.0)
PROTEIN: NEGATIVE mg/dL

## 2018-05-15 MED ORDER — METRONIDAZOLE 500 MG PO TABS: 500.0000 mg | ORAL_TABLET | Freq: Two times a day (BID) | ORAL | 0 refills | Status: DC

## 2018-05-15 NOTE — ED Triage Notes (Signed)
Pt c/o of a strong vaginal odor x1wk, states is spotting so unsure of discharge

## 2018-05-15 NOTE — ED Provider Notes (Signed)
[facility] EMERGENCY DEPARTMENT Provider Note   CSN: [id number] Arrival date & time: 05/15/18  1537     History   Chief Complaint Chief Complaint  Patient presents with  . vaginal odor    HPI Sandra French is a 27 y.o. female.  =    Sandra French is a 27 y.o. female presents to emergency department complaining of vaginal order.  Patient states she noticed order about a week ago.  She denies any discharge.  She states that she has had some spotting but states she is due for her next Depakote shot.  Denies any vaginal discomfort.  There is no vaginal or abdominal pain.  No urinary symptoms.  She does not think she is pregnant.  States that she was on antibiotics 3 weeks ago for dental extraction.  Denies any scented personal products.  Has been in a monogamous relationship, last intercourse 2 weeks ago, did not use protection.  No other complaints. No foreign bodies  History reviewed. No pertinent past medical history.  There are no active problems to display for this patient.   History reviewed. No pertinent surgical history.   OB History    Gravida  2   Para      Term      Preterm      AB      Living        SAB      TAB      Ectopic      Multiple      Live Births               Home Medications    Prior to Admission medications   Medication Sig Start Date End Date Taking? Authorizing Provider  ibuprofen (ADVIL,MOTRIN) 600 MG tablet Take 1 tablet (600 mg total) by mouth every 6 (six) hours as needed. 08/20/17   Horton, Mayer Masker, MD  metroNIDAZOLE (FLAGYL) 500 MG tablet Take 1 tablet (500 mg total) by mouth 2 (two) times daily. 03/29/18   Maxwell Caul, PA-C    Family History No family history on file.  Social History Social History   Tobacco Use  . Smoking status: Former Smoker    Packs/day: 0.50  . Smokeless tobacco: Never Used  Substance Use Topics  . Alcohol use: Yes    Comment: occ  . Drug use: No    Types:  Marijuana     Allergies   Penicillins   Review of Systems Review of Systems  Constitutional: Negative for chills and fever.  Respiratory: Negative for cough, chest tightness and shortness of breath.   Cardiovascular: Negative for chest pain, palpitations and leg swelling.  Gastrointestinal: Negative for abdominal pain, diarrhea, nausea and vomiting.  Genitourinary: Negative for dysuria, pelvic pain, vaginal bleeding, vaginal discharge and vaginal pain.       Positive for vaginal odor  Musculoskeletal: Negative for arthralgias, myalgias, neck pain and neck stiffness.  Skin: Negative for rash.  Neurological: Negative for dizziness, weakness and headaches.  All other systems reviewed and are negative.    Physical Exam Updated Vital Signs BP 123/82 (BP Location: Left Arm)   Pulse 96   Temp 98.4 F (36.9 C) (Oral)   Resp 18   Ht 5\' 4"  (1.626 m)   Wt 74.8 kg (165 lb)   SpO2 97%   BMI 28.32 kg/m   Physical Exam  Constitutional: She appears well-developed and well-nourished. No distress.  HENT:  Head: Normocephalic.  Eyes: Conjunctivae  are normal.  Neck: Neck supple.  Cardiovascular: Normal rate, regular rhythm and normal heart sounds.  Pulmonary/Chest: Effort normal and breath sounds normal. No respiratory distress. She has no wheezes. She has no rales.  Abdominal: Soft. Bowel sounds are normal. She exhibits no distension. There is no tenderness. There is no rebound.  Genitourinary:  Genitourinary Comments: Normal external genitalia. Normal vaginal canal. Small thin white discharge. Cervix is normal, closed. No CMT. No uterine or adnexal tenderness. No masses palpated.    Musculoskeletal: She exhibits no edema.  Neurological: She is alert.  Skin: Skin is warm and dry.  Psychiatric: She has a normal mood and affect. Her behavior is normal.  Nursing note and vitals reviewed.    ED Treatments / Results  Labs (all labs ordered are listed, but only abnormal results are  displayed) Labs Reviewed  URINALYSIS, ROUTINE W REFLEX MICROSCOPIC - Abnormal; Notable for the following components:      Result Value   APPearance CLOUDY (*)    Specific Gravity, Urine >1.030 (*)    Hgb urine dipstick TRACE (*)    Leukocytes, UA TRACE (*)    All other components within normal limits  URINALYSIS, MICROSCOPIC (REFLEX) - Abnormal; Notable for the following components:   Bacteria, UA RARE (*)    All other components within normal limits  WET PREP, GENITAL  PREGNANCY, URINE  GC/CHLAMYDIA PROBE AMP () [facility]    EKG None  Radiology No results found.  Procedures Procedures (including critical care time)  Medications Ordered in ED Medications - No data to display   Initial Impression / Assessment and Plan / ED Course  I have reviewed the triage vital signs and the nursing notes.  Pertinent labs & imaging results that were available during my care of the patient were reviewed by me and considered in my medical decision making (see chart for details).     Patient in the emergency department with vaginal order.  Denies discharge.  No pain.  No fever or chills.  One sexual partner but does not use protection.  Exam unremarkable, specifically no cervical motion tenderness, no uterine or adnexal tenderness.  I do not see much discharge.  Wet prep obtained showing clue cells.  This certainly could cause her to have order.  Will start on Flagyl.  Gonorrhea chlamydia culture sent.  Patient declined syphilis and HIV test.  We will have a follow-up with her family doctor, or OB/GYN, or health department as needed.  Return precautions discussed.  Vitals:   05/15/18 1544 05/15/18 1703  BP: 123/82 (!) 129/95  Pulse: 96 84  Resp: 18 20  Temp: 98.4 F (36.9 C) 98.9 F (37.2 C)  TempSrc: Oral Oral  SpO2: 97% 100%  Weight: 74.8 kg (165 lb)   Height: 5\' 4"  (1.626 m)      Final Clinical Impressions(s) / ED Diagnoses   Final diagnoses:  Bacterial vaginosis     ED Discharge Orders        Ordered    metroNIDAZOLE (FLAGYL) 500 MG tablet  2 times daily     05/15/18 1738       Jaynie CrumbleKirichenko, Joangel Vanosdol, PA-C 05/15/18 1829    Tegeler, Canary Brimhristopher J, MD 05/16/18 1939

## 2018-05-15 NOTE — Discharge Instructions (Addendum)
Take Flagyl as prescribed until all gone for bacterial vaginosis.  Your gonorrhea and Chlamydia cultures are pending and if come back abnormal we will call you.  Please follow-up with family doctor or OB/GYN as needed

## 2018-05-18 LAB — GC/CHLAMYDIA PROBE AMP (~~LOC~~) NOT AT ARMC
CHLAMYDIA, DNA PROBE: NEGATIVE
Neisseria Gonorrhea: NEGATIVE

## 2018-05-26 ENCOUNTER — Other Ambulatory Visit: Payer: Self-pay

## 2018-05-26 ENCOUNTER — Encounter (HOSPITAL_BASED_OUTPATIENT_CLINIC_OR_DEPARTMENT_OTHER): Payer: Self-pay

## 2018-05-26 ENCOUNTER — Emergency Department (HOSPITAL_BASED_OUTPATIENT_CLINIC_OR_DEPARTMENT_OTHER)
Admission: EM | Admit: 2018-05-26 | Discharge: 2018-05-26 | Disposition: A | Discharge status: Home / Self Care | Payer: Medicaid Other | Attending: Emergency Medicine | Admitting: Emergency Medicine

## 2018-05-26 DIAGNOSIS — R531 Weakness: Secondary | ICD-10-CM

## 2018-05-26 DIAGNOSIS — Z87891 Personal history of nicotine dependence: Secondary | ICD-10-CM | POA: Insufficient documentation

## 2018-05-26 LAB — CBC WITH DIFFERENTIAL/PLATELET
BASOS ABS: 0 10*3/uL (ref 0.0–0.1)
Basophils Relative: 0 %
EOS ABS: 0.1 10*3/uL (ref 0.0–0.7)
EOS PCT: 1 %
HCT: 39 % (ref 36.0–46.0)
HEMOGLOBIN: 13.4 g/dL (ref 12.0–15.0)
LYMPHS ABS: 2.6 10*3/uL (ref 0.7–4.0)
LYMPHS PCT: 36 %
MCH: 30 pg (ref 26.0–34.0)
MCHC: 34.4 g/dL (ref 30.0–36.0)
MCV: 87.4 fL (ref 78.0–100.0)
Monocytes Absolute: 0.5 10*3/uL (ref 0.1–1.0)
Monocytes Relative: 6 %
NEUTROS PCT: 57 %
Neutro Abs: 4.1 10*3/uL (ref 1.7–7.7)
PLATELETS: 231 10*3/uL (ref 150–400)
RBC: 4.46 MIL/uL (ref 3.87–5.11)
RDW: 13.3 % (ref 11.5–15.5)
WBC: 7.2 10*3/uL (ref 4.0–10.5)

## 2018-05-26 LAB — BASIC METABOLIC PANEL
Anion gap: 3 — ABNORMAL LOW (ref 5–15)
BUN: 12 mg/dL (ref 6–20)
CHLORIDE: 109 mmol/L (ref 101–111)
CO2: 24 mmol/L (ref 22–32)
Calcium: 8.9 mg/dL (ref 8.9–10.3)
Creatinine, Ser: 0.82 mg/dL (ref 0.44–1.00)
Glucose, Bld: 93 mg/dL (ref 65–99)
POTASSIUM: 3.9 mmol/L (ref 3.5–5.1)
SODIUM: 136 mmol/L (ref 135–145)

## 2018-05-26 LAB — PREGNANCY, URINE: PREG TEST UR: NEGATIVE

## 2018-05-26 MED ORDER — SODIUM CHLORIDE 0.9 % IV BOLUS
1000.0000 mL | Freq: Once | INTRAVENOUS | Status: AC
  Administered 2018-05-26: 1000 mL via INTRAVENOUS

## 2018-05-26 NOTE — ED Triage Notes (Signed)
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 Pt c/o feeling tire, weak and sleeping a lot.

## 2018-05-26 NOTE — ED Provider Notes (Signed)
[facility] EMERGENCY DEPARTMENT Provider Note   CSN: [id number] Arrival date & time: 05/26/18  1101     History   Chief Complaint Chief Complaint  Patient presents with  . Weakness    HPI Sandra French is a 27 y.o. female.  HPI  27 year old female presents for evaluation of weakness and fatigue.  She is concerned about anemia.  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  She states they were not in artery.  She is been feeling overall weak and diffusely fatigued despite feeling like she is getting enough sleep over the last 3 or 4 days.  She has had a mild headache but nothing like a typical migraine she gets which are much more severe.  No known fevers, cough, chest pain, abdominal pain, vomiting, diarrhea, or urinary symptoms.  She states she feels chronically short of breath over the last 3 months since she stopped smoking but this is not worse than typical.  There is no focal weakness or numbness.  History reviewed. No pertinent past medical history.  There are no active problems to display for this patient.   History reviewed. No pertinent surgical history.   OB History    Gravida  2   Para      Term      Preterm      AB      Living        SAB      TAB      Ectopic      Multiple      Live Births               Home Medications    Prior to Admission medications   Medication Sig Start Date End Date Taking? Authorizing Provider  ibuprofen (ADVIL,MOTRIN) 600 MG tablet Take 1 tablet (600 mg total) by mouth every 6 (six) hours as needed. 08/20/17   Horton, Mayer Maskerourtney F, MD  metroNIDAZOLE (FLAGYL) 500 MG tablet Take 1 tablet (500 mg total) by mouth 2 (two) times daily. 05/15/18   Jaynie CrumbleKirichenko, Tatyana, PA-C    Family History No family history on file.  Social History Social History   Tobacco Use  . Smoking status: Former Smoker    Packs/day: 0.50  . Smokeless  tobacco: Never Used  Substance Use Topics  . Alcohol use: Yes    Comment: occ  . Drug use: No    Types: Marijuana     Allergies   Penicillins   Review of Systems Review of Systems  Constitutional: Positive for fatigue.  Eyes: Negative for visual disturbance.  Respiratory: Negative for cough and shortness of breath.   Cardiovascular: Negative for chest pain.  Gastrointestinal: Negative for abdominal pain, diarrhea and vomiting.  Genitourinary: Negative for dysuria and menstrual problem.  Neurological: Positive for light-headedness and headaches. Negative for numbness.  All other systems reviewed and are negative.    Physical Exam Updated Vital Signs BP (!) 136/106 (BP Location: Right Arm)   Pulse 79   Temp 99.2 F (37.3 C) (Oral)   Resp 18   Ht 5\' 3"  (1.6 m)   Wt 72.6 kg (160 lb)   LMP 05/12/2018   SpO2 99%   BMI 28.34 kg/m   Physical Exam  Constitutional: She is oriented to person, place, and time. She appears well-developed and well-nourished. No distress.  HENT:  Head: Normocephalic and atraumatic.  Right Ear: External  ear normal.  Left Ear: External ear normal.  Nose: Nose normal.  Eyes: Pupils are equal, round, and reactive to light. EOM are normal. Right eye exhibits no discharge. Left eye exhibits no discharge.  Neck: Neck supple.  Cardiovascular: Normal rate, regular rhythm and normal heart sounds.  No murmur heard. Pulmonary/Chest: Effort normal and breath sounds normal.  Abdominal: Soft. There is no tenderness.  Neurological: She is alert and oriented to person, place, and time.  CN 3-12 grossly intact. 5/5 strength in all 4 extremities. Grossly normal sensation. Normal finger to nose.   Skin: Skin is warm and dry. She is not diaphoretic.  Nursing note and vitals reviewed.    ED Treatments / Results  Labs (all labs ordered are listed, but only abnormal results are displayed) Labs Reviewed  BASIC METABOLIC PANEL - Abnormal; Notable for the  following components:      Result Value   Anion gap 3 (*)    All other components within normal limits  CBC WITH DIFFERENTIAL/PLATELET  PREGNANCY, URINE    EKG EKG Interpretation  Date/Time:  Tuesday May 26 2018 11:50:04 EDT Ventricular Rate:  73 PR Interval:    QRS Duration: 83 QT Interval:  367 QTC Calculation: 405 R Axis:   90 Text Interpretation:  Normal sinus rhythm Borderline right axis deviation no acute ST/T changes no significant change since 2013 Confirmed by Pricilla Loveless [phone]) on 05/26/2018 12:02:59 PM   Radiology No results found.  Procedures Procedures (including critical care time)  Medications Ordered in ED Medications  sodium chloride 0.9 % bolus 1,000 mL (1,000 mLs Intravenous New Bag/Given 05/26/18 1143)     Initial Impression / Assessment and Plan / ED Course  I have reviewed the triage vital signs and the nursing notes.  Pertinent labs & imaging results that were available during my care of the patient were reviewed by me and considered in my medical decision making (see chart for details).     No clear cause for the patient's generalized fatigue.  No focal neuro deficits on exam.  She has a mild to moderate headache but this is unlikely to be a significant CNS pathology based on history and exam.  Lab work reassuring.  Hemoglobin is stable at 13.  She has a low-grade temperature of 99 but has not been having obvious fevers or infectious symptoms.  At this point I think she is stable for discharge home to follow-up outpatient with her PCP.  Return precautions.  Final Clinical Impressions(s) / ED Diagnoses   Final diagnoses:  Generalized weakness    ED Discharge Orders    None       Pricilla Loveless, MD 05/26/18 1242

## 2018-08-16 ENCOUNTER — Encounter (HOSPITAL_BASED_OUTPATIENT_CLINIC_OR_DEPARTMENT_OTHER): Payer: Self-pay | Admitting: *Deleted

## 2018-08-16 ENCOUNTER — Emergency Department (HOSPITAL_BASED_OUTPATIENT_CLINIC_OR_DEPARTMENT_OTHER)
Admission: EM | Admit: 2018-08-16 | Discharge: 2018-08-17 | Disposition: A | Discharge status: Home / Self Care | Payer: Medicaid Other | Attending: Emergency Medicine | Admitting: Emergency Medicine

## 2018-08-16 ENCOUNTER — Other Ambulatory Visit: Payer: Self-pay

## 2018-08-16 DIAGNOSIS — Z711 Person with feared health complaint in whom no diagnosis is made: Secondary | ICD-10-CM

## 2018-08-16 DIAGNOSIS — N898 Other specified noninflammatory disorders of vagina: Secondary | ICD-10-CM

## 2018-08-16 DIAGNOSIS — Z87891 Personal history of nicotine dependence: Secondary | ICD-10-CM | POA: Insufficient documentation

## 2018-08-16 DIAGNOSIS — Z113 Encounter for screening for infections with a predominantly sexual mode of transmission: Secondary | ICD-10-CM | POA: Insufficient documentation

## 2018-08-16 DIAGNOSIS — N760 Acute vaginitis: Secondary | ICD-10-CM | POA: Insufficient documentation

## 2018-08-16 DIAGNOSIS — Z79899 Other long term (current) drug therapy: Secondary | ICD-10-CM | POA: Insufficient documentation

## 2018-08-16 DIAGNOSIS — B9689 Other specified bacterial agents as the cause of diseases classified elsewhere: Secondary | ICD-10-CM

## 2018-08-16 LAB — URINALYSIS, ROUTINE W REFLEX MICROSCOPIC
Bilirubin Urine: NEGATIVE
GLUCOSE, UA: NEGATIVE mg/dL
Ketones, ur: NEGATIVE mg/dL
Leukocytes, UA: NEGATIVE
Nitrite: NEGATIVE
PH: 7 (ref 5.0–8.0)
PROTEIN: NEGATIVE mg/dL
Specific Gravity, Urine: 1.02 (ref 1.005–1.030)

## 2018-08-16 LAB — PREGNANCY, URINE: PREG TEST UR: NEGATIVE

## 2018-08-16 NOTE — ED Notes (Signed)
Pelvic cart at the bedside 

## 2018-08-16 NOTE — ED Triage Notes (Signed)
Pt reports vaginal irritation x 3 days. States she began having discharge today

## 2018-08-17 LAB — WET PREP, GENITAL
Sperm: NONE SEEN
TRICH WET PREP: NONE SEEN
YEAST WET PREP: NONE SEEN

## 2018-08-17 LAB — URINALYSIS, MICROSCOPIC (REFLEX)

## 2018-08-17 MED ORDER — AZITHROMYCIN 250 MG PO TABS
1000.0000 mg | ORAL_TABLET | Freq: Once | ORAL | Status: AC
  Administered 2018-08-17: 1000 mg via ORAL
  Filled 2018-08-17: qty 4

## 2018-08-17 MED ORDER — LIDOCAINE HCL (PF) 1 % IJ SOLN
INTRAMUSCULAR | Status: AC
  Administered 2018-08-17: 5 mL
  Filled 2018-08-17: qty 5

## 2018-08-17 MED ORDER — METRONIDAZOLE 500 MG PO TABS: 500.0000 mg | ORAL_TABLET | Freq: Two times a day (BID) | ORAL | 0 refills | Status: DC

## 2018-08-17 MED ORDER — CEFTRIAXONE SODIUM 250 MG IJ SOLR
250.0000 mg | Freq: Once | INTRAMUSCULAR | Status: AC
  Administered 2018-08-17: 250 mg via INTRAMUSCULAR
  Filled 2018-08-17: qty 250

## 2018-08-17 NOTE — ED Provider Notes (Signed)
[facility] EMERGENCY DEPARTMENT Provider Note   CSN: [id number] Arrival date & time: 08/16/18  2303     History   Chief Complaint Chief Complaint  Patient presents with  . Vaginal Discharge    HPI Sandra French is a 27 y.o. female.  HPI  This is a 27 year old female who presents with vaginal discharge and bleeding.  Patient reports 2 to 3-day history of worsening vaginal discharge or bleeding.  She has one sexual partner but states that he left to go out of town for his birthday and "I do not know what he did."  She does not consistently use condoms.  Unknown whether her partner has any symptoms.  Denies dysuria or hematuria.  Denies back pain or fevers.  No abdominal pain, nausea, vomiting.  History reviewed. No pertinent past medical history.  There are no active problems to display for this patient.   History reviewed. No pertinent surgical history.   OB History    Gravida  2   Para      Term      Preterm      AB      Living        SAB      TAB      Ectopic      Multiple      Live Births               Home Medications    Prior to Admission medications   Medication Sig Start Date End Date Taking? Authorizing Provider  ibuprofen (ADVIL,MOTRIN) 600 MG tablet Take 1 tablet (600 mg total) by mouth every 6 (six) hours as needed. 08/20/17   Emika Tiano, Mayer Masker, MD  metroNIDAZOLE (FLAGYL) 500 MG tablet Take 1 tablet (500 mg total) by mouth 2 (two) times daily. 08/17/18   Doyal Saric, Mayer Masker, MD    Family History No family history on file.  Social History Social History   Tobacco Use  . Smoking status: Former Smoker    Packs/day: 0.50  . Smokeless tobacco: Never Used  Substance Use Topics  . Alcohol use: Yes    Comment: occ  . Drug use: Not Currently    Types: Marijuana     Allergies   Penicillins   Review of Systems Review of Systems  Constitutional: Negative for fever.  Gastrointestinal: Negative for abdominal pain,  nausea and vomiting.  Genitourinary: Positive for vaginal discharge. Negative for dysuria and vaginal bleeding.  All other systems reviewed and are negative.    Physical Exam Updated Vital Signs BP (!) 136/93 (BP Location: Left Arm)   Pulse 77   Temp 98.3 F (36.8 C) (Oral)   Resp 16   Ht 5\' 5"  (1.651 m)   Wt 75.8 kg   SpO2 100%   Breastfeeding? No   BMI 27.79 kg/m   Physical Exam  Constitutional: She is oriented to person, place, and time. She appears well-developed and well-nourished.  HENT:  Head: Normocephalic and atraumatic.  Neck: Neck supple.  Cardiovascular: Normal rate and regular rhythm.  Pulmonary/Chest: Effort normal. No respiratory distress. She has no wheezes.  Abdominal: Soft. Bowel sounds are normal. There is no tenderness.  Genitourinary:  Genitourinary Comments: Normal external vaginal exam, no lesions noted, scant vaginal discharge noted, slight cervical friability  Neurological: She is alert and oriented to person, place, and time.  Skin: Skin is warm and dry.  Psychiatric: She has a normal mood and affect.  Nursing note and vitals reviewed.  ED Treatments / Results  Labs (all labs ordered are listed, but only abnormal results are displayed) Labs Reviewed  WET PREP, GENITAL - Abnormal; Notable for the following components:      Result Value   Clue Cells Wet Prep HPF POC PRESENT (*)    WBC, Wet Prep HPF POC MODERATE (*)    All other components within normal limits  URINALYSIS, ROUTINE W REFLEX MICROSCOPIC - Abnormal; Notable for the following components:   APPearance HAZY (*)    Hgb urine dipstick TRACE (*)    All other components within normal limits  URINALYSIS, MICROSCOPIC (REFLEX) - Abnormal; Notable for the following components:   Bacteria, UA MANY (*)    All other components within normal limits  PREGNANCY, URINE  GC/CHLAMYDIA PROBE AMP ([place]) NOT AT [facility]    EKG None  Radiology No results found.  Procedures Procedures  (including critical care time)  Medications Ordered in ED Medications  cefTRIAXone (ROCEPHIN) injection 250 mg (has no administration in time range)  azithromycin (ZITHROMAX) tablet 1,000 mg (has no administration in time range)     Initial Impression / Assessment and Plan / ED Course  I have reviewed the triage vital signs and the nursing notes.  Pertinent labs & imaging results that were available during my care of the patient were reviewed by me and considered in my medical decision making (see chart for details).     She presents with vaginal discharge.  She is overall nontoxic-appearing.  Afebrile.  Vaginal exam is largely reassuring.  Patient was tested and treatment for STDs given concerns for possible partner indiscretion.  She does have clue cells on her wet prep.  She has had bacterial vaginosis in the past.  Given her symptoms, we will treat this as well with metronidazole.  Urinalysis without evidence of UTI.  Will discharge home.  Patient was counseled regarding safe sex practices.  After history, exam, and medical workup I feel the patient has been appropriately medically screened and is safe for discharge home. Pertinent diagnoses were discussed with the patient. Patient was given return precautions.   Final Clinical Impressions(s) / ED Diagnoses   Final diagnoses:  Vaginal discharge  Concern about STD in female without diagnosis  BV (bacterial vaginosis)    ED Discharge Orders         Ordered    metroNIDAZOLE (FLAGYL) 500 MG tablet  2 times daily     08/17/18 0016           Acadia Thammavong, Mayer Masker, MD 08/17/18 0040

## 2018-08-17 NOTE — Discharge Instructions (Addendum)
You were seen today for vaginal discharge.  You were tested and treated for STDs.  Your wet prep does show some clue cells which can indicate normal bacterial overgrowth or bacterial vaginosis.  Will be treated with Flagyl.  Do not drink alcohol while taking Flagyl.  Abstain from sexual activity for the next 10 days.  You will be informed of any positive STD testing.

## 2018-08-18 LAB — GC/CHLAMYDIA PROBE AMP (~~LOC~~) NOT AT ARMC
CHLAMYDIA, DNA PROBE: NEGATIVE
NEISSERIA GONORRHEA: NEGATIVE

## 2018-10-20 ENCOUNTER — Other Ambulatory Visit: Payer: Self-pay

## 2018-10-20 ENCOUNTER — Encounter (HOSPITAL_BASED_OUTPATIENT_CLINIC_OR_DEPARTMENT_OTHER): Payer: Self-pay | Admitting: Emergency Medicine

## 2018-10-20 ENCOUNTER — Emergency Department (HOSPITAL_BASED_OUTPATIENT_CLINIC_OR_DEPARTMENT_OTHER)
Admission: EM | Admit: 2018-10-20 | Discharge: 2018-10-20 | Disposition: A | Discharge status: Home / Self Care | Payer: Self-pay | Attending: Emergency Medicine | Admitting: Emergency Medicine

## 2018-10-20 DIAGNOSIS — Z87891 Personal history of nicotine dependence: Secondary | ICD-10-CM | POA: Insufficient documentation

## 2018-10-20 DIAGNOSIS — Z3202 Encounter for pregnancy test, result negative: Secondary | ICD-10-CM | POA: Insufficient documentation

## 2018-10-20 DIAGNOSIS — J020 Streptococcal pharyngitis: Secondary | ICD-10-CM | POA: Insufficient documentation

## 2018-10-20 LAB — GROUP A STREP BY PCR: Group A Strep by PCR: DETECTED — AB

## 2018-10-20 LAB — PREGNANCY, URINE: Preg Test, Ur: NEGATIVE

## 2018-10-20 MED ORDER — CLINDAMYCIN HCL 300 MG PO CAPS: 300.0000 mg | ORAL_CAPSULE | Freq: Three times a day (TID) | ORAL | 0 refills | Status: AC

## 2018-10-20 MED ORDER — DEXAMETHASONE 6 MG PO TABS
10.0000 mg | ORAL_TABLET | Freq: Once | ORAL | Status: AC
  Administered 2018-10-20: 12:00:00 10 mg via ORAL
  Filled 2018-10-20: qty 1

## 2018-10-20 MED FILL — CLINDAMYCIN HCL 300 MG CAP: 300 | 10 days supply | Qty: 30 | Fill #0

## 2018-10-20 NOTE — ED Provider Notes (Signed)
[facility] EMERGENCY DEPARTMENT Provider Note   CSN: [id number] Arrival date & time: 10/20/18  1011     History   Chief Complaint Chief Complaint  Patient presents with  . Sore Throat    HPI Sandra French is a 27 y.o. female who presents with sore thraot for the past 3 days. She has developed some nasal congestion, nausea, and nonproductive cough. She has also had some body aches and mild L ear pain. She has noticed some L ear pain. Her roommate has strep throat, her sone has an ear infection, and her daughter has a cold. She has not taken any medication at home for her symptoms.  HPI  History reviewed. No pertinent past medical history.  There are no active problems to display for this patient.   History reviewed. No pertinent surgical history.   OB History    Gravida  2   Para      Term      Preterm      AB      Living        SAB      TAB      Ectopic      Multiple      Live Births               Home Medications    Prior to Admission medications   Medication Sig Start Date End Date Taking? Authorizing Provider  clindamycin (CLEOCIN) 300 MG capsule Take 1 capsule (300 mg total) by mouth 3 (three) times daily for 10 days. 10/20/18 10/30/18  Emi Holes, PA-C  ibuprofen (ADVIL,MOTRIN) 600 MG tablet Take 1 tablet (600 mg total) by mouth every 6 (six) hours as needed. 08/20/17   Horton, Mayer Masker, MD  metroNIDAZOLE (FLAGYL) 500 MG tablet Take 1 tablet (500 mg total) by mouth 2 (two) times daily. 08/17/18   Horton, Mayer Masker, MD    Family History No family history on file.  Social History Social History   Tobacco Use  . Smoking status: Former Smoker    Packs/day: 0.50  . Smokeless tobacco: Never Used  Substance Use Topics  . Alcohol use: Yes    Comment: occ  . Drug use: Not Currently    Types: Marijuana     Allergies   Penicillins   Review of Systems Review of Systems  Constitutional: Negative for chills and fever.   HENT: Positive for congestion and sore throat.   Respiratory: Positive for cough. Negative for shortness of breath.   Cardiovascular: Negative for chest pain.  Gastrointestinal: Positive for nausea. Negative for abdominal pain, diarrhea and vomiting.  Musculoskeletal: Positive for myalgias.     Physical Exam Updated Vital Signs BP (!) 133/94 (BP Location: Right Arm)   Pulse 95   Temp 98.3 F (36.8 C) (Oral)   Resp 16   Ht 5\' 4"  (1.626 m)   Wt 81.6 kg   SpO2 100%   BMI 30.90 kg/m   Physical Exam  Constitutional: She appears well-developed and well-nourished. No distress.  HENT:  Head: Normocephalic and atraumatic.  Mouth/Throat: Posterior oropharyngeal edema and posterior oropharyngeal erythema present. No oropharyngeal exudate or tonsillar abscesses.  Eyes: Pupils are equal, round, and reactive to light. Conjunctivae are normal. Right eye exhibits no discharge. Left eye exhibits no discharge. No scleral icterus.  Neck: Normal range of motion. Neck supple. No thyromegaly present.  Cardiovascular: Normal rate, regular rhythm, normal heart sounds and intact distal pulses. Exam reveals no gallop  and no friction rub.  No murmur heard. Pulmonary/Chest: Effort normal and breath sounds normal. No stridor. No respiratory distress. She has no wheezes. She has no rales.  Abdominal: Soft. Bowel sounds are normal. She exhibits no distension. There is no tenderness. There is no rebound and no guarding.  Musculoskeletal: She exhibits no edema.  Lymphadenopathy:    She has no cervical adenopathy.  Neurological: She is alert. Coordination normal.  Skin: Skin is warm and dry. No rash noted. She is not diaphoretic. No pallor.  Psychiatric: She has a normal mood and affect.  Nursing note and vitals reviewed.    ED Treatments / Results  Labs (all labs ordered are listed, but only abnormal results are displayed) Labs Reviewed  GROUP A STREP BY PCR - Abnormal; Notable for the following  components:      Result Value   Group A Strep by PCR DETECTED (*)    All other components within normal limits  PREGNANCY, URINE    EKG None  Radiology No results found.  Procedures Procedures (including critical care time)  Medications Ordered in ED Medications  dexamethasone (DECADRON) tablet 10 mg (10 mg Oral Given 10/20/18 1151)     Initial Impression / Assessment and Plan / ED Course  I have reviewed the triage vital signs and the nursing notes.  Pertinent labs & imaging results that were available during my care of the patient were reviewed by me and considered in my medical decision making (see chart for details).     Patient presenting with a sore throat x3 days.  She has not had any fever, but has had some dry cough.  Strep PCR is positive.  Patient requested urine pregnancy test, which is negative.  Patient will be treated with clindamycin considering penicillin allergy.  She is advised to take with probiotic.  Single dose Decadron given in the ED.  I advised Tylenol and ibuprofen for symptom control.  Return precautions discussed.  Patient understands and agrees with plan.  Patient vitals stable throughout ED course and discharged in satisfactory condition.  Final Clinical Impressions(s) / ED Diagnoses   Final diagnoses:  Strep throat    ED Discharge Orders         Ordered    clindamycin (CLEOCIN) 300 MG capsule  3 times daily     10/20/18 [address], PA-C 10/20/18 1449    Melene Plan, DO 10/20/18 1500

## 2018-10-20 NOTE — Discharge Instructions (Addendum)
Take clindamycin until completed.  I recommend taking a probiotic daily or eating a yogurt while you are on this medication.  Do not take them at the same time as the clindamycin, but sometime during the day.  You can alternate ibuprofen and Tylenol as prescribed over-the-counter, as needed for your pain.  You can gargle with warm salt water, which can be soothing to your throat.  Please return the emergency department if you develop any new or worsening symptoms.

## 2018-10-20 NOTE — ED Triage Notes (Signed)
Sore throat x 3 days

## 2018-10-20 NOTE — ED Notes (Signed)
ED Provider at bedside. 

## 2018-10-26 ENCOUNTER — Encounter (HOSPITAL_BASED_OUTPATIENT_CLINIC_OR_DEPARTMENT_OTHER): Payer: Self-pay | Admitting: Emergency Medicine

## 2018-10-26 ENCOUNTER — Emergency Department (HOSPITAL_BASED_OUTPATIENT_CLINIC_OR_DEPARTMENT_OTHER)
Admission: EM | Admit: 2018-10-26 | Discharge: 2018-10-26 | Disposition: A | Discharge status: Home / Self Care | Payer: Self-pay | Attending: Emergency Medicine | Admitting: Emergency Medicine

## 2018-10-26 ENCOUNTER — Other Ambulatory Visit: Payer: Self-pay

## 2018-10-26 DIAGNOSIS — B373 Candidiasis of vulva and vagina: Secondary | ICD-10-CM | POA: Insufficient documentation

## 2018-10-26 DIAGNOSIS — Z87891 Personal history of nicotine dependence: Secondary | ICD-10-CM | POA: Insufficient documentation

## 2018-10-26 DIAGNOSIS — B3731 Acute candidiasis of vulva and vagina: Secondary | ICD-10-CM

## 2018-10-26 DIAGNOSIS — R319 Hematuria, unspecified: Secondary | ICD-10-CM | POA: Insufficient documentation

## 2018-10-26 LAB — URINALYSIS, MICROSCOPIC (REFLEX)

## 2018-10-26 LAB — URINALYSIS, ROUTINE W REFLEX MICROSCOPIC
BILIRUBIN URINE: NEGATIVE
Glucose, UA: NEGATIVE mg/dL
Ketones, ur: NEGATIVE mg/dL
Leukocytes, UA: NEGATIVE
Nitrite: NEGATIVE
PROTEIN: NEGATIVE mg/dL
Specific Gravity, Urine: 1.01 (ref 1.005–1.030)
pH: 6 (ref 5.0–8.0)

## 2018-10-26 MED ORDER — FLUCONAZOLE 150 MG PO TABS: 150.0000 mg | ORAL_TABLET | Freq: Once | ORAL | 0 refills | Status: AC

## 2018-10-26 MED ORDER — FLUCONAZOLE 50 MG PO TABS
150.0000 mg | ORAL_TABLET | Freq: Once | ORAL | Status: AC
  Administered 2018-10-26: 150 mg via ORAL
  Filled 2018-10-26: qty 1

## 2018-10-26 NOTE — ED Triage Notes (Signed)
Patient was here on 11/5 5th for strep throat and received antibiotics. Within a day of starting them, she contracted a yeast infection. Last night, she tried monostat OTC and it has since gotten worse with blood while wiping after using the bathroom. Says that diflucan usually works well for her. Pt is accompanied by her two small children.

## 2018-10-26 NOTE — ED Provider Notes (Signed)
Emergency Department Provider Note   I have reviewed the triage vital signs and the nursing notes.   HISTORY  Chief Complaint Vaginal Itching and Vaginal Discharge   HPI Sandra French is a 27 y.o. female who was recently treated for dental infection and was within 2 days of starting the antibiotics developed vaginal itching and thick discharge.  She is not sexually active.  She does not have any abdominal pain.  No previous history of STDs but does have history of yeast infections and this feels the same.  She tried Monistat which seemed to make symptoms worse but deftly not better.  No urinary symptoms. No other associated or modifying symptoms.    History reviewed. No pertinent past medical history.  There are no active problems to display for this patient.   History reviewed. No pertinent surgical history.  Current Outpatient Rx  . Order #: [id number] Class: Normal  . [START ON 10/29/2018] Order #: [id number] Class: Print  . Order #: [id number] Class: Print  . Order #: [id number] Class: Print    Allergies Penicillins  History reviewed. No pertinent family history.  Social History Social History   Tobacco Use  . Smoking status: Former Smoker    Packs/day: 0.50  . Smokeless tobacco: Never Used  Substance Use Topics  . Alcohol use: Yes    Comment: occ  . Drug use: Not Currently    Types: Marijuana    Review of Systems  All other systems negative except as documented in the HPI. All pertinent positives and negatives as reviewed in the HPI. ____________________________________________   PHYSICAL EXAM:  VITAL SIGNS: ED Triage Vitals  Enc Vitals Group     BP 10/26/18 0904 (!) 144/91     Pulse Rate 10/26/18 0904 98     Resp 10/26/18 0904 16     Temp 10/26/18 0904 98.5 F (36.9 C)     Temp Source 10/26/18 0904 Oral     SpO2 10/26/18 0904 99 %     Weight 10/26/18 0905 180 lb (81.6 kg)     Height 10/26/18 0905 5\' 4"  (1.626 m)    Constitutional: Alert and  oriented. Well appearing and in no acute distress. Eyes: Conjunctivae are normal. PERRL. EOMI. Head: Atraumatic. Nose: No congestion/rhinnorhea. Mouth/Throat: Mucous membranes are moist.  Oropharynx non-erythematous. Neck: No stridor.  No meningeal signs.   Cardiovascular: Normal rate, regular rhythm. Good peripheral circulation. Grossly normal heart sounds.   Respiratory: Normal respiratory effort.  No retractions. Lungs CTAB. Gastrointestinal: Soft and nontender. No distention.  Musculoskeletal: No lower extremity tenderness nor edema. No gross deformities of extremities. Neurologic:  Normal speech and language. No gross focal neurologic deficits are appreciated.  Skin:  Skin is warm, dry and intact. No rash noted. Vaginal: thick, white discharge. No purulent discharge. No obvious rash.   ____________________________________________   LABS (all labs ordered are listed, but only abnormal results are displayed)  Labs Reviewed  URINALYSIS, ROUTINE W REFLEX MICROSCOPIC - Abnormal; Notable for the following components:      Result Value   APPearance HAZY (*)    Hgb urine dipstick TRACE (*)    All other components within normal limits  URINALYSIS, MICROSCOPIC (REFLEX) - Abnormal; Notable for the following components:   Bacteria, UA RARE (*)    All other components within normal limits  URINE CULTURE   ____________________________________________   INITIAL IMPRESSION / ASSESSMENT AND PLAN / ED COURSE  This likely to be STDs without purulent discharge or sexual activity and this is  so similar to yeast infections in the past that started within a day or 2 of antibiotics will treat for yeast infection at this time.  Urine is clean as far as urinary tract infections ago, culture sent.  Extra dose given in case symptoms not improved by 3 days.  If not improved by week will need follow-up with her primary doctor.  Pertinent labs & imaging results that were available during my care of the  patient were reviewed by me and considered in my medical decision making (see chart for details).  ____________________________________________  FINAL CLINICAL IMPRESSION(S) / ED DIAGNOSES  Final diagnoses:  Vaginal yeast infection     MEDICATIONS GIVEN DURING THIS VISIT:  Medications  fluconazole (DIFLUCAN) tablet 150 mg (has no administration in time range)     NEW OUTPATIENT MEDICATIONS STARTED DURING THIS VISIT:  New Prescriptions   FLUCONAZOLE (DIFLUCAN) 150 MG TABLET    Take 1 tablet (150 mg total) by mouth once for 1 dose. If symptoms not improving.    Note:  This note was prepared with assistance of Dragon voice recognition software. Occasional wrong-word or sound-a-like substitutions may have occurred due to the inherent limitations of voice recognition software.   Marily Memos, MD 10/26/18 1027

## 2018-10-27 LAB — URINE CULTURE: Culture: <10000 — AB

## 2019-06-24 ENCOUNTER — Encounter (HOSPITAL_BASED_OUTPATIENT_CLINIC_OR_DEPARTMENT_OTHER): Payer: Self-pay | Admitting: *Deleted

## 2019-06-24 ENCOUNTER — Emergency Department (HOSPITAL_BASED_OUTPATIENT_CLINIC_OR_DEPARTMENT_OTHER)
Admission: EM | Admit: 2019-06-24 | Discharge: 2019-06-24 | Disposition: A | Discharge status: Home / Self Care | Payer: Medicaid Other | Attending: Emergency Medicine | Admitting: Emergency Medicine

## 2019-06-24 ENCOUNTER — Other Ambulatory Visit: Payer: Self-pay

## 2019-06-24 DIAGNOSIS — Z79899 Other long term (current) drug therapy: Secondary | ICD-10-CM | POA: Insufficient documentation

## 2019-06-24 DIAGNOSIS — Z87891 Personal history of nicotine dependence: Secondary | ICD-10-CM | POA: Insufficient documentation

## 2019-06-24 DIAGNOSIS — J020 Streptococcal pharyngitis: Secondary | ICD-10-CM | POA: Diagnosis not present

## 2019-06-24 DIAGNOSIS — J029 Acute pharyngitis, unspecified: Secondary | ICD-10-CM | POA: Diagnosis present

## 2019-06-24 LAB — GROUP A STREP BY PCR: Group A Strep by PCR: DETECTED — AB

## 2019-06-24 MED ORDER — FLUCONAZOLE 150 MG PO TABS: ORAL_TABLET | ORAL | 0 refills | Status: DC

## 2019-06-24 MED ORDER — DEXAMETHASONE SODIUM PHOSPHATE 10 MG/ML IJ SOLN
10.0000 mg | Freq: Once | INTRAMUSCULAR | Status: AC
  Administered 2019-06-24: 17:00:00 10 mg via INTRAMUSCULAR
  Filled 2019-06-24: qty 1

## 2019-06-24 MED ORDER — CLINDAMYCIN HCL 300 MG PO CAPS: 300.0000 mg | ORAL_CAPSULE | Freq: Three times a day (TID) | ORAL | 0 refills | Status: AC

## 2019-06-24 MED FILL — FLUCONAZOLE 150 MG TABS: 150 | 3 days supply | Qty: 2 | Fill #0

## 2019-06-24 MED FILL — CLINDAMYCIN HCL 300 MG CAPS: 300 | 10 days supply | Qty: 30 | Fill #0

## 2019-06-24 NOTE — ED Triage Notes (Signed)
Sore throat since yesterday. She has white patches on her tonsils this am per pt. She thinks she has strep throat.

## 2019-06-24 NOTE — Discharge Instructions (Signed)
You were seen in the emergency department and diagnosed with strep throat.  You were given a shot of Decadron.  - Decadron is a steroid used to treat the pain and swelling of your throat.  -Prescription has been sent to the pharmacy for clindamycin.  This is an antibiotic.  Please take it as prescribed.  You should gradually feel better over the next few days. Take Tylenol and Ibuprofen for fever and pain. Follow up with your primary care provider in the next 1 week if you are not feeling better, if you do not have a primary care provider one is provided in your discharge instructions. Return to the emergency department for any new or worsening symptoms including but not limited to inability to open your mouth, inability to move your neck, worsening pain, change in your voice, inability to swallow your own saliva, drooling, or any other concerns.

## 2019-06-24 NOTE — ED Provider Notes (Signed)
[facility] EMERGENCY DEPARTMENT Provider Note   CSN: [id number] Arrival date & time: 06/24/19  1521    History   Chief Complaint Chief Complaint  Patient presents with  . Sore Throat    HPI Sandra French is a 28 y.o. female [name] emergency department today with chief complaint of sore throat x2 days.  She also has nasal congestion, nonproductive cough, generalized body aches. She states her throat is sore when she swallows, rates pain 8 out of 10 in severity. Pain does not radiate.  She has not taken any medications for symptoms prior to arrival.  She thinks she might of had a fever but did not check her temperature.  She denies chills, chest pain, shortness of breath, abdominal pain, nausea, vomiting.  Also denies any sick contacts.  Denies any contact with someone positive for COVID-19. History provided by patient with additional history obtained from chart review.      History reviewed. No pertinent past medical history.  There are no active problems to display for this patient.   History reviewed. No pertinent surgical history.   OB History    Gravida  2   Para      Term      Preterm      AB      Living        SAB      TAB      Ectopic      Multiple      Live Births               Home Medications    Prior to Admission medications   Medication Sig Start Date End Date Taking? Authorizing Provider  clindamycin (CLEOCIN) 300 MG capsule Take 1 capsule (300 mg total) by mouth 3 (three) times daily for 10 days. 06/24/19 07/04/19  Jaria Conway E, PA-C  fluconazole (DIFLUCAN) 150 MG tablet Take if symptoms of yeast infection. If still having symptoms take second pill 72 hours later 06/24/19   Geronimo Diliberto, Yvonna AlanisKaitlyn E, PA-C  ibuprofen (ADVIL,MOTRIN) 600 MG tablet Take 1 tablet (600 mg total) by mouth every 6 (six) hours as needed. 08/20/17   Horton, Mayer Maskerourtney F, MD  metroNIDAZOLE (FLAGYL) 500 MG tablet Take 1 tablet (500 mg total) by mouth 2 (two) times  daily. 08/17/18   Horton, Mayer Maskerourtney F, MD    Family History No family history on file.  Social History Social History   Tobacco Use  . Smoking status: Former Smoker    Packs/day: 0.50  . Smokeless tobacco: Never Used  Substance Use Topics  . Alcohol use: Yes    Comment: occ  . Drug use: Not Currently    Types: Marijuana     Allergies   Penicillins   Review of Systems Review of Systems  Constitutional: Positive for fever.  HENT: Positive for congestion and sore throat. Negative for ear pain and sinus pain.   Respiratory: Positive for cough.   Gastrointestinal: Negative for abdominal pain, nausea and vomiting.  Musculoskeletal: Positive for myalgias.  Allergic/Immunologic: Negative for immunocompromised state.     Physical Exam Updated Vital Signs BP 105/77   Pulse 95   Temp 98.5 F (36.9 C) (Oral)   Resp 20   Ht 5\' 5"  (1.651 m)   Wt 78.6 kg   SpO2 99%   BMI 28.82 kg/m   Physical Exam Vitals signs and nursing note reviewed.  Constitutional:      Appearance: She is well-developed. She is not ill-appearing  or toxic-appearing.  HENT:     Head: Normocephalic and atraumatic.     Nose: Nose normal.     Mouth/Throat:     Lips: Pink.     Mouth: Mucous membranes are moist.     Pharynx: Uvula midline. No uvula swelling.     Comments: Minor erythema to oropharynx,  1+ right tonsillar swelling with exudate, voice normal, neck supple without lymphadenopathy  Eyes:     General: No scleral icterus.       Right eye: No discharge.        Left eye: No discharge.     Conjunctiva/sclera: Conjunctivae normal.  Neck:     Musculoskeletal: Normal range of motion.     Vascular: No JVD.  Cardiovascular:     Rate and Rhythm: Normal rate and regular rhythm.     Pulses: Normal pulses.     Heart sounds: Normal heart sounds.  Pulmonary:     Effort: Pulmonary effort is normal.     Breath sounds: Normal breath sounds.  Abdominal:     General: There is no distension.   Musculoskeletal: Normal range of motion.  Skin:    General: Skin is warm and dry.  Neurological:     Mental Status: She is oriented to person, place, and time.     GCS: GCS eye subscore is 4. GCS verbal subscore is 5. GCS motor subscore is 6.     Comments: Fluent speech, no facial droop.  Psychiatric:        Behavior: Behavior normal.      ED Treatments / Results  Labs (all labs ordered are listed, but only abnormal results are displayed) Labs Reviewed  GROUP A STREP BY PCR - Abnormal; Notable for the following components:      Result Value   Group A Strep by PCR DETECTED (*)    All other components within normal limits    EKG None  Radiology No results found.  Procedures Procedures (including critical care time)  Medications Ordered in ED Medications  dexamethasone (DECADRON) injection 10 mg (has no administration in time range)     Initial Impression / Assessment and Plan / ED Course  I have reviewed the triage vital signs and the nursing notes.  Pertinent labs & imaging results that were available during my care of the patient were reviewed by me and considered in my medical decision making (see chart for details).  28 yo female who presents with sore throat. Still able to tolerate PO/secretions but with worsening pain. Patient is afebrile, non-toxic appearing, sitting comfortably on examination table. Vital signs reviewed and stable. On exam presentation not concerning for PTA or Ludwig's angina, Uvulitis, epiglottitis, peritonsillar abscess, or retropharyngeal abscess. Strep ordered at triage.   Strep reviewed. Positive. Pt treated with  steroids while in the ED. she has penicillin allergy therefore will discharge with prescription for clindamycin. Pt does not appear dehydrated, but did discuss importance of water rehydration.  Encouraged at home supportive care measures. Patient successfully fluid challenged in the ED without difficulty swallowing.  Strict return  precautions given. NAD. VSS.  Patient discharged with prescription for Diflucan.  Chart review shows last visit 6 months ago for strep she had return visit shortly after for yeast infection.  Discussed with patient she should only take if symptoms of yeast infection occur while taking antibiotic.  Recommended PCP follow up for re-evaluation.   This note was prepared using Dragon voice recognition software and may include unintentional dictation errors  due to the inherent limitations of voice recognition software.    Final Clinical Impressions(s) / ED Diagnoses   Final diagnoses:  Strep throat    ED Discharge Orders         Ordered    clindamycin (CLEOCIN) 300 MG capsule  3 times daily     06/24/19 1624    fluconazole (DIFLUCAN) 150 MG tablet     06/24/19 1624           Mccayla Shimada, Harley Hallmark, PA-C 06/24/19 1629    Julianne Rice, MD 06/24/19 2236

## 2019-07-13 ENCOUNTER — Emergency Department (HOSPITAL_BASED_OUTPATIENT_CLINIC_OR_DEPARTMENT_OTHER)
Admission: EM | Admit: 2019-07-13 | Discharge: 2019-07-13 | Disposition: A | Discharge status: Home / Self Care | Payer: Medicaid Other | Attending: Emergency Medicine | Admitting: Emergency Medicine

## 2019-07-13 ENCOUNTER — Other Ambulatory Visit: Payer: Self-pay

## 2019-07-13 ENCOUNTER — Encounter (HOSPITAL_BASED_OUTPATIENT_CLINIC_OR_DEPARTMENT_OTHER): Payer: Self-pay | Admitting: *Deleted

## 2019-07-13 DIAGNOSIS — Z87891 Personal history of nicotine dependence: Secondary | ICD-10-CM | POA: Diagnosis not present

## 2019-07-13 DIAGNOSIS — Y9289 Other specified places as the place of occurrence of the external cause: Secondary | ICD-10-CM | POA: Diagnosis not present

## 2019-07-13 DIAGNOSIS — Y998 Other external cause status: Secondary | ICD-10-CM | POA: Insufficient documentation

## 2019-07-13 DIAGNOSIS — X500XXA Overexertion from strenuous movement or load, initial encounter: Secondary | ICD-10-CM | POA: Insufficient documentation

## 2019-07-13 DIAGNOSIS — S3992XA Unspecified injury of lower back, initial encounter: Secondary | ICD-10-CM | POA: Diagnosis present

## 2019-07-13 DIAGNOSIS — S39012A Strain of muscle, fascia and tendon of lower back, initial encounter: Secondary | ICD-10-CM | POA: Diagnosis not present

## 2019-07-13 DIAGNOSIS — Y9389 Activity, other specified: Secondary | ICD-10-CM | POA: Insufficient documentation

## 2019-07-13 MED ORDER — METHOCARBAMOL 500 MG PO TABS: 500.0000 mg | ORAL_TABLET | Freq: Every day | ORAL | 0 refills | Status: AC

## 2019-07-13 MED ORDER — IBUPROFEN 600 MG PO TABS: 600.0000 mg | ORAL_TABLET | Freq: Four times a day (QID) | ORAL | 0 refills | Status: DC | PRN

## 2019-07-13 MED ORDER — ACETAMINOPHEN 325 MG PO TABS: 650.0000 mg | ORAL_TABLET | Freq: Four times a day (QID) | ORAL | 0 refills | Status: AC | PRN

## 2019-07-13 NOTE — Discharge Instructions (Signed)

## 2019-07-13 NOTE — ED Triage Notes (Signed)
Back pain after doing a lot of bending over and picking up objects. She had a steroid Rx for the pain but she is no better.

## 2019-07-13 NOTE — ED Provider Notes (Signed)
[facility] EMERGENCY DEPARTMENT Provider Note   CSN: [id number] Arrival date & time: 07/13/19  1336    History   Chief Complaint Chief Complaint  Patient presents with  . Back Pain    HPI Sandra French is a 28 y.o. female.     HPI   Patient is a 28 year old female who presents the emergency department today complaining of mid/lower back pain that started about 2 weeks ago.  States that she was moving boxes that were very heavy when she felt a pain in her back.  States her muscles felt very tight.  She has some radiation of pain down to the bilateral buttocks but it does not radiate completely down the legs.  She has had no numbness/weakness to the legs.  No loss control of bowel or bladder function.  No saddle anesthesia.  No history of IV drug use, fevers or malignancy.  She had a telehealth visit earlier this week and was prescribed with a prednisone course which she finished today.  States she has not had any relief with this medication.  She denies any GI/GU symptoms.  History reviewed. No pertinent past medical history.  There are no active problems to display for this patient.   History reviewed. No pertinent surgical history.   OB History    Gravida  2   Para      Term      Preterm      AB      Living        SAB      TAB      Ectopic      Multiple      Live Births               Home Medications    Prior to Admission medications   Medication Sig Start Date End Date Taking? Authorizing Provider  acetaminophen (TYLENOL) 325 MG tablet Take 2 tablets (650 mg total) by mouth every 6 (six) hours as needed. Do not take more than 4000mg  of tylenol per day 07/13/19   Gurshan Settlemire S, PA-C  fluconazole (DIFLUCAN) 150 MG tablet Take if symptoms of yeast infection. If still having symptoms take second pill 72 hours later 06/24/19   Albrizze, Yvonna AlanisKaitlyn E, PA-C  ibuprofen (ADVIL) 600 MG tablet Take 1 tablet (600 mg total) by mouth every 6 (six)  hours as needed. 07/13/19   Matias Thurman S, PA-C  methocarbamol (ROBAXIN) 500 MG tablet Take 1 tablet (500 mg total) by mouth at bedtime for 5 days. 07/13/19 07/18/19  Jafar Poffenberger S, PA-C  metroNIDAZOLE (FLAGYL) 500 MG tablet Take 1 tablet (500 mg total) by mouth 2 (two) times daily. 08/17/18   Horton, Mayer Maskerourtney F, MD    Family History No family history on file.  Social History Social History   Tobacco Use  . Smoking status: Former Smoker    Packs/day: 0.50  . Smokeless tobacco: Never Used  Substance Use Topics  . Alcohol use: Yes    Comment: occ  . Drug use: Not Currently    Types: Marijuana     Allergies   Penicillins   Review of Systems Review of Systems  Constitutional: Negative for fever.  Respiratory: Negative for shortness of breath.   Cardiovascular: Negative for chest pain.  Gastrointestinal: Negative for abdominal pain, blood in stool, constipation, diarrhea, nausea and vomiting.  Genitourinary: Negative for dysuria, flank pain, frequency, hematuria and urgency.  Musculoskeletal: Positive for back pain. Negative for gait problem.  Skin: Negative for wound.  Neurological: Negative for weakness and numbness.     Physical Exam Updated Vital Signs BP 109/70   Pulse 73   Temp 98.5 F (36.9 C) (Oral)   Resp 14   Ht 5\' 4"  (1.626 m)   Wt 77.1 kg   SpO2 98%   BMI 29.18 kg/m   Physical Exam Vitals signs and nursing note reviewed.  Constitutional:      General: She is not in acute distress.    Appearance: She is well-developed.  HENT:     Head: Normocephalic and atraumatic.  Eyes:     Conjunctiva/sclera: Conjunctivae normal.  Neck:     Musculoskeletal: Neck supple.  Cardiovascular:     Rate and Rhythm: Normal rate.     Pulses: Normal pulses.     Heart sounds: Normal heart sounds. No murmur.  Pulmonary:     Effort: Pulmonary effort is normal.     Breath sounds: Normal breath sounds. No wheezing, rhonchi or rales.  Abdominal:     Palpations:  Abdomen is soft.     Tenderness: There is no guarding or rebound.  Musculoskeletal: Normal range of motion.     Comments: Mild midline lumbar ttp with associated paraspinous muscle ttp bilaterally.   Skin:    General: Skin is warm and dry.  Neurological:     Mental Status: She is alert.     Comments: Motor:  Normal tone. 5/5 strength of BUE and BLE major muscle groups including strong and equal grip strength and dorsiflexion/plantar flexion Sensory: light touch normal in all extremities. Gait: normal gait and balance.        ED Treatments / Results  Labs (all labs ordered are listed, but only abnormal results are displayed) Labs Reviewed - No data to display  EKG None  Radiology No results found.  Procedures Procedures (including critical care time)  Medications Ordered in ED Medications - No data to display   Initial Impression / Assessment and Plan / ED Course  I have reviewed the triage vital signs and the nursing notes.  Pertinent labs & imaging results that were available during my care of the patient were reviewed by me and considered in my medical decision making (see chart for details).    Final Clinical Impressions(s) / ED Diagnoses   Final diagnoses:  Strain of lumbar region, initial encounter   Patient with back pain.  No neurological deficits and normal neuro exam.  Patient can walk but states is painful.  No loss of bowel or bladder control.  No concern for cauda equina.  No fever, night sweats, weight loss, h/o cancer, IVDU.  RICE protocol and pain medicine indicated and discussed with patient. Offered xray but pt declines. Discussed possibility of herniated disc and need for f/u. She voices understanding and is in agreement with plan. All questions answered, pt stable for discharge.   ED Discharge Orders         Ordered    acetaminophen (TYLENOL) 325 MG tablet  Every 6 hours PRN     07/13/19 1551    ibuprofen (ADVIL) 600 MG tablet  Every 6 hours  PRN     07/13/19 1551    methocarbamol (ROBAXIN) 500 MG tablet  Daily at bedtime     07/13/19 1551           Gearldean Lomanto S, PA-C 07/13/19 1557    Isla Pence, MD 07/13/19 [phone]

## 2019-09-27 ENCOUNTER — Other Ambulatory Visit: Payer: Self-pay

## 2019-09-27 ENCOUNTER — Emergency Department (HOSPITAL_BASED_OUTPATIENT_CLINIC_OR_DEPARTMENT_OTHER)
Admission: EM | Admit: 2019-09-27 | Discharge: 2019-09-28 | Disposition: A | Discharge status: Home / Self Care | Payer: Medicaid Other | Attending: Emergency Medicine | Admitting: Emergency Medicine

## 2019-09-27 ENCOUNTER — Encounter (HOSPITAL_BASED_OUTPATIENT_CLINIC_OR_DEPARTMENT_OTHER): Payer: Self-pay

## 2019-09-27 DIAGNOSIS — Z880 Allergy status to penicillin: Secondary | ICD-10-CM | POA: Diagnosis not present

## 2019-09-27 DIAGNOSIS — B9689 Other specified bacterial agents as the cause of diseases classified elsewhere: Secondary | ICD-10-CM

## 2019-09-27 DIAGNOSIS — N899 Noninflammatory disorder of vagina, unspecified: Secondary | ICD-10-CM | POA: Diagnosis present

## 2019-09-27 DIAGNOSIS — Z87891 Personal history of nicotine dependence: Secondary | ICD-10-CM | POA: Diagnosis not present

## 2019-09-27 DIAGNOSIS — N760 Acute vaginitis: Secondary | ICD-10-CM | POA: Insufficient documentation

## 2019-09-27 NOTE — ED Triage Notes (Signed)
Pt c/o vaginal d/c x 1 week-NAD-steady gait 

## 2019-09-28 LAB — URINALYSIS, ROUTINE W REFLEX MICROSCOPIC
Bilirubin Urine: NEGATIVE
Glucose, UA: NEGATIVE mg/dL
Hgb urine dipstick: NEGATIVE
Ketones, ur: NEGATIVE mg/dL
Leukocytes,Ua: NEGATIVE
Nitrite: NEGATIVE
Protein, ur: NEGATIVE mg/dL
Specific Gravity, Urine: 1.015 (ref 1.005–1.030)
pH: 7.5 (ref 5.0–8.0)

## 2019-09-28 LAB — WET PREP, GENITAL
Sperm: NONE SEEN
Trich, Wet Prep: NONE SEEN
Yeast Wet Prep HPF POC: NONE SEEN

## 2019-09-28 LAB — PREGNANCY, URINE: Preg Test, Ur: NEGATIVE

## 2019-09-28 MED ORDER — FLUCONAZOLE 150 MG PO TABS: 150.0000 mg | ORAL_TABLET | Freq: Every day | ORAL | 0 refills | Status: AC

## 2019-09-28 MED ORDER — METRONIDAZOLE 500 MG PO TABS: 500.0000 mg | ORAL_TABLET | Freq: Two times a day (BID) | ORAL | 0 refills | Status: AC

## 2019-09-28 NOTE — ED Provider Notes (Signed)
[facility] EMERGENCY DEPARTMENT Provider Note   CSN: [id number] Arrival date & time: 09/27/19  2102     History   Chief Complaint Chief Complaint  Patient presents with  . Vaginal Discharge    HPI Sandra French is a 28 y.o. female.     The history is provided by the patient.  Vaginal Discharge Quality:  White Severity:  Moderate Onset quality:  Gradual Duration:  1 week Timing:  Constant Progression:  Worsening Chronicity:  New Relieved by:  None tried Worsened by:  Nothing Associated symptoms: abdominal pain and vaginal itching   Associated symptoms: no dysuria, no fever and no vomiting   Patient reports she had IUD placed in September.  Since that time she said mild abdominal cramping.  Over the past week she has had whitish vaginal discharge.  No vaginal bleeding.  No dyspareunia.  No dysuria. She is in a monogamous relationship without concern for STDs    PMH-none OB History    Gravida  2   Para      Term      Preterm      AB      Living        SAB      TAB      Ectopic      Multiple      Live Births               Home Medications    Prior to Admission medications   Medication Sig Start Date End Date Taking? Authorizing Provider  acetaminophen (TYLENOL) 325 MG tablet Take 2 tablets (650 mg total) by mouth every 6 (six) hours as needed. Do not take more than 4000mg  of tylenol per day 07/13/19   Couture, Cortni S, PA-C  fluconazole (DIFLUCAN) 150 MG tablet Take 1 tablet (150 mg total) by mouth daily. 09/28/19   09/30/19, MD  metroNIDAZOLE (FLAGYL) 500 MG tablet Take 1 tablet (500 mg total) by mouth 2 (two) times daily. One po bid x 7 days 09/28/19   09/30/19, MD    Family History No family history on file.  Social History Social History   Tobacco Use  . Smoking status: Former Smoker    Packs/day: 0.50  . Smokeless tobacco: Never Used  Substance Use Topics  . Alcohol use: Not Currently  . Drug use:  Not Currently     Allergies   Penicillins   Review of Systems Review of Systems  Constitutional: Negative for fever.  Gastrointestinal: Positive for abdominal pain. Negative for vomiting.  Genitourinary: Positive for vaginal discharge. Negative for dysuria.  All other systems reviewed and are negative.    Physical Exam Updated Vital Signs BP 132/88 (BP Location: Right Arm)   Pulse 70   Temp 98.5 F (36.9 C) (Oral)   Resp 16   Ht 1.651 m (5\' 5" )   Wt 77.1 kg   SpO2 99%   BMI 28.29 kg/m   Physical Exam CONSTITUTIONAL: Well developed/well nourished HEAD: Normocephalic/atraumatic EYES: EOMI ENMT: Mucous membranes moist NECK: supple no meningeal signs CV: S1/S2 noted, no murmurs/rubs/gallops noted LUNGS: Lungs are clear to auscultation bilaterally, no apparent distress ABDOMEN: soft, nontender, no rebound or guarding, bowel sounds noted throughout abdomen GU:no cva tenderness, small amount of whitish discharge.  No vaginal bleeding.  No vaginal lesions.  Female chaperone present NEURO: Pt is awake/alert/appropriate, moves all extremitiesx4.  No facial droop.   EXTREMITIES: pulses normal/equal, full ROM SKIN: warm, color normal PSYCH:  no abnormalities of mood noted, alert and oriented to situation   ED Treatments / Results  Labs (all labs ordered are listed, but only abnormal results are displayed) Labs Reviewed  WET PREP, GENITAL - Abnormal; Notable for the following components:      Result Value   Clue Cells Wet Prep HPF POC PRESENT (*)    WBC, Wet Prep HPF POC MODERATE (*)    All other components within normal limits  PREGNANCY, URINE  URINALYSIS, ROUTINE W REFLEX MICROSCOPIC  GC/CHLAMYDIA PROBE AMP ([place]) NOT AT [facility]    EKG None  Radiology No results found.  Procedures Procedures    Medications Ordered in ED Medications - No data to display   Initial Impression / Assessment and Plan / ED Course  I have reviewed the triage vital signs and  the nursing notes.  Pertinent labs   results that were available during my care of the patient were reviewed by me and considered in my medical decision making (see chart for details).        We will treat for BV.  Patient also request meds for yeast infection. Low suspicion for STD at this time Final Clinical Impressions(s) / ED Diagnoses   Final diagnoses:  Bacterial vaginosis    ED Discharge Orders         Ordered    metroNIDAZOLE (FLAGYL) 500 MG tablet  2 times daily     09/28/19 0232    fluconazole (DIFLUCAN) 150 MG tablet  Daily     09/28/19 0232           Ripley Fraise, MD 09/28/19 [phone]

## 2019-09-28 NOTE — ED Notes (Signed)
ED Provider at bedside. 

## 2019-09-29 LAB — GC/CHLAMYDIA PROBE AMP (~~LOC~~) NOT AT ARMC
Chlamydia: NEGATIVE
Neisseria Gonorrhea: NEGATIVE

## 2020-01-21 ENCOUNTER — Ambulatory Visit: Payer: Medicaid Other | Attending: Internal Medicine

## 2020-01-21 DIAGNOSIS — Z20822 Contact with and (suspected) exposure to covid-19: Secondary | ICD-10-CM

## 2020-01-23 LAB — NOVEL CORONAVIRUS, NAA: SARS-CoV-2, NAA: NOT DETECTED

## 2020-01-24 ENCOUNTER — Other Ambulatory Visit: Payer: Medicaid Other

## 2020-02-29 ENCOUNTER — Encounter (HOSPITAL_COMMUNITY): Payer: Self-pay | Admitting: Emergency Medicine

## 2020-02-29 ENCOUNTER — Other Ambulatory Visit: Payer: Self-pay

## 2020-02-29 ENCOUNTER — Emergency Department (HOSPITAL_COMMUNITY)
Admission: EM | Admit: 2020-02-29 | Discharge: 2020-02-29 | Disposition: A | Discharge status: Home / Self Care | Payer: Managed Care, Other (non HMO) | Attending: Emergency Medicine | Admitting: Emergency Medicine

## 2020-02-29 DIAGNOSIS — Y92410 Unspecified street and highway as the place of occurrence of the external cause: Secondary | ICD-10-CM | POA: Insufficient documentation

## 2020-02-29 DIAGNOSIS — Y999 Unspecified external cause status: Secondary | ICD-10-CM | POA: Diagnosis not present

## 2020-02-29 DIAGNOSIS — Z79899 Other long term (current) drug therapy: Secondary | ICD-10-CM | POA: Diagnosis not present

## 2020-02-29 DIAGNOSIS — S46911A Strain of unspecified muscle, fascia and tendon at shoulder and upper arm level, right arm, initial encounter: Secondary | ICD-10-CM | POA: Insufficient documentation

## 2020-02-29 DIAGNOSIS — Y93I9 Activity, other involving external motion: Secondary | ICD-10-CM | POA: Insufficient documentation

## 2020-02-29 DIAGNOSIS — S4991XA Unspecified injury of right shoulder and upper arm, initial encounter: Secondary | ICD-10-CM | POA: Diagnosis present

## 2020-02-29 NOTE — ED Notes (Signed)
Pt called multiple times for triage, has not responded.
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
## 2020-02-29 NOTE — Discharge Instructions (Signed)
You may use over-the-counter Motrin (Ibuprofen), Acetaminophen (Tylenol), topical muscle creams such as SalonPas, Icy Hot, Bengay, etc. Please stretch, apply heat, and have massage therapy for additional assistance. ° °
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
## 2020-06-15 DIAGNOSIS — Z419 Encounter for procedure for purposes other than remedying health state, unspecified: Secondary | ICD-10-CM | POA: Diagnosis not present

## 2020-07-16 DIAGNOSIS — Z419 Encounter for procedure for purposes other than remedying health state, unspecified: Secondary | ICD-10-CM | POA: Diagnosis not present

## 2020-08-16 DIAGNOSIS — Z419 Encounter for procedure for purposes other than remedying health state, unspecified: Secondary | ICD-10-CM | POA: Diagnosis not present

## 2020-09-15 DIAGNOSIS — Z419 Encounter for procedure for purposes other than remedying health state, unspecified: Secondary | ICD-10-CM | POA: Diagnosis not present

## 2020-10-16 DIAGNOSIS — Z419 Encounter for procedure for purposes other than remedying health state, unspecified: Secondary | ICD-10-CM | POA: Diagnosis not present

## 2020-11-15 DIAGNOSIS — Z419 Encounter for procedure for purposes other than remedying health state, unspecified: Secondary | ICD-10-CM | POA: Diagnosis not present

## 2020-12-16 DIAGNOSIS — Z419 Encounter for procedure for purposes other than remedying health state, unspecified: Secondary | ICD-10-CM | POA: Diagnosis not present

## 2021-01-04 DIAGNOSIS — L299 Pruritus, unspecified: Secondary | ICD-10-CM | POA: Diagnosis not present

## 2021-01-16 DIAGNOSIS — Z419 Encounter for procedure for purposes other than remedying health state, unspecified: Secondary | ICD-10-CM | POA: Diagnosis not present

## 2021-02-13 DIAGNOSIS — Z419 Encounter for procedure for purposes other than remedying health state, unspecified: Secondary | ICD-10-CM | POA: Diagnosis not present

## 2021-02-13 DIAGNOSIS — Z113 Encounter for screening for infections with a predominantly sexual mode of transmission: Secondary | ICD-10-CM | POA: Diagnosis not present

## 2021-02-13 DIAGNOSIS — B373 Candidiasis of vulva and vagina: Secondary | ICD-10-CM | POA: Diagnosis not present

## 2021-03-05 DIAGNOSIS — Z13220 Encounter for screening for lipoid disorders: Secondary | ICD-10-CM | POA: Diagnosis not present

## 2021-03-05 DIAGNOSIS — R5383 Other fatigue: Secondary | ICD-10-CM | POA: Diagnosis not present

## 2021-03-05 DIAGNOSIS — Z1329 Encounter for screening for other suspected endocrine disorder: Secondary | ICD-10-CM | POA: Diagnosis not present

## 2021-03-05 DIAGNOSIS — R0683 Snoring: Secondary | ICD-10-CM | POA: Diagnosis not present

## 2021-03-05 DIAGNOSIS — Z13228 Encounter for screening for other metabolic disorders: Secondary | ICD-10-CM | POA: Diagnosis not present

## 2021-03-05 DIAGNOSIS — Z7689 Persons encountering health services in other specified circumstances: Secondary | ICD-10-CM | POA: Diagnosis not present

## 2021-03-05 DIAGNOSIS — Z130 Encounter for screening for diseases of the blood and blood-forming organs and certain disorders involving the immune mechanism: Secondary | ICD-10-CM | POA: Diagnosis not present

## 2021-03-05 DIAGNOSIS — Z0000 Encounter for general adult medical examination without abnormal findings: Secondary | ICD-10-CM | POA: Diagnosis not present

## 2021-03-16 DIAGNOSIS — Z419 Encounter for procedure for purposes other than remedying health state, unspecified: Secondary | ICD-10-CM | POA: Diagnosis not present

## 2021-04-15 DIAGNOSIS — Z419 Encounter for procedure for purposes other than remedying health state, unspecified: Secondary | ICD-10-CM | POA: Diagnosis not present

## 2021-05-10 DIAGNOSIS — G4719 Other hypersomnia: Secondary | ICD-10-CM | POA: Diagnosis not present

## 2021-05-10 DIAGNOSIS — R0683 Snoring: Secondary | ICD-10-CM | POA: Diagnosis not present

## 2021-05-16 DIAGNOSIS — Z419 Encounter for procedure for purposes other than remedying health state, unspecified: Secondary | ICD-10-CM | POA: Diagnosis not present

## 2021-06-15 DIAGNOSIS — Z419 Encounter for procedure for purposes other than remedying health state, unspecified: Secondary | ICD-10-CM | POA: Diagnosis not present

## 2021-07-16 DIAGNOSIS — Z419 Encounter for procedure for purposes other than remedying health state, unspecified: Secondary | ICD-10-CM | POA: Diagnosis not present

## 2021-08-16 DIAGNOSIS — Z419 Encounter for procedure for purposes other than remedying health state, unspecified: Secondary | ICD-10-CM | POA: Diagnosis not present

## 2021-09-15 DIAGNOSIS — Z419 Encounter for procedure for purposes other than remedying health state, unspecified: Secondary | ICD-10-CM | POA: Diagnosis not present

## 2021-10-16 DIAGNOSIS — Z419 Encounter for procedure for purposes other than remedying health state, unspecified: Secondary | ICD-10-CM | POA: Diagnosis not present

## 2021-11-15 DIAGNOSIS — Z419 Encounter for procedure for purposes other than remedying health state, unspecified: Secondary | ICD-10-CM | POA: Diagnosis not present

## 2021-12-16 DIAGNOSIS — Z419 Encounter for procedure for purposes other than remedying health state, unspecified: Secondary | ICD-10-CM | POA: Diagnosis not present

## 2021-12-19 ENCOUNTER — Encounter (HOSPITAL_BASED_OUTPATIENT_CLINIC_OR_DEPARTMENT_OTHER): Payer: Self-pay | Admitting: Emergency Medicine

## 2021-12-19 ENCOUNTER — Other Ambulatory Visit: Payer: Self-pay

## 2021-12-19 ENCOUNTER — Emergency Department (HOSPITAL_BASED_OUTPATIENT_CLINIC_OR_DEPARTMENT_OTHER)
Admission: EM | Admit: 2021-12-19 | Discharge: 2021-12-19 | Disposition: A | Discharge status: Home / Self Care | Payer: Medicaid Other | Attending: Emergency Medicine | Admitting: Emergency Medicine

## 2021-12-19 DIAGNOSIS — J029 Acute pharyngitis, unspecified: Secondary | ICD-10-CM | POA: Diagnosis present

## 2021-12-19 DIAGNOSIS — U071 COVID-19: Secondary | ICD-10-CM | POA: Diagnosis not present

## 2021-12-19 LAB — RESP PANEL BY RT-PCR (FLU A&B, COVID) ARPGX2
Influenza A by PCR: NEGATIVE
Influenza B by PCR: NEGATIVE
SARS Coronavirus 2 by RT PCR: POSITIVE — AB

## 2021-12-19 LAB — GROUP A STREP BY PCR: Group A Strep by PCR: NOT DETECTED

## 2021-12-19 MED ORDER — CYCLOBENZAPRINE HCL 10 MG PO TABS: 10.0000 mg | ORAL_TABLET | Freq: Two times a day (BID) | ORAL | 0 refills | Status: AC | PRN

## 2021-12-19 MED ORDER — BENZONATATE 100 MG PO CAPS: 100.0000 mg | ORAL_CAPSULE | Freq: Three times a day (TID) | ORAL | 0 refills | Status: AC | PRN

## 2021-12-19 MED ORDER — ONDANSETRON 4 MG PO TBDP: 4.0000 mg | ORAL_TABLET | Freq: Three times a day (TID) | ORAL | 0 refills | Status: AC | PRN

## 2021-12-19 NOTE — Discharge Instructions (Addendum)
You were evaluated today for a acute respiratory illness.  We have tested you for strep, flu, and COVID.  At this point, you have resulted negative for strep, and the flu, however you are positive for COVID-19.  I recommend continued symptom management at home.  Advised that you return for reevaluation if you have persistent inability to keep food or fluids down, have difficulty breathing due to the feeling of your throat closing up, high fever.

## 2021-12-19 NOTE — ED Provider Notes (Signed)
[facility] Provider Note   CSN: [id number] Arrival date & time: 12/19/21  0745     History  Chief Complaint  Patient presents with   Sore Throat   Generalized Body Aches    Sandra French is a 31 y.o. female with no reported past medical history who presents for evaluation of 1 day of nausea, vomiting, body aches.    Patient states that yesterday morning she began experiencing nausea and vomiting making her unable to keep food down however she is able to keep fluids down, body aches particularly bothersome in her neck and hips, no measured fever but alternating chills and diaphoresis, sore throat, headache, extreme fatigue morning of 1/3.  She also endorses seeing "tonsil stones" yesterday morning.  She denies experiencing trouble breathing or shortness of breath, chest pain.  Patient is tried NyQuil cold and flu which helped only with sleep, promethazine for nausea which did not alleviate symptoms, and Advil for body aches which also did not help.  She denies any known sick contacts.  She has not gotten her flu shot this year, nor she gotten her COVID-vaccine.    Home Medications Prior to Admission medications   Medication Sig Start Date End Date Taking? Authorizing Provider  benzonatate (TESSALON) 100 MG capsule Take 1 capsule (100 mg total) by mouth 3 (three) times daily as needed for cough. 12/19/21  Yes Gareth Morgan, MD  cyclobenzaprine (FLEXERIL) 10 MG tablet Take 1 tablet (10 mg total) by mouth 2 (two) times daily as needed for muscle spasms. 12/19/21  Yes Gareth Morgan, MD  ondansetron (ZOFRAN-ODT) 4 MG disintegrating tablet Take 1 tablet (4 mg total) by mouth every 8 (eight) hours as needed for nausea or vomiting. 12/19/21  Yes Gareth Morgan, MD  acetaminophen (TYLENOL) 325 MG tablet Take 2 tablets (650 mg total) by mouth every 6 (six) hours as needed. Do not take more than 4000mg  of tylenol per day 07/13/19   Couture, Cortni S, PA-C   fluconazole (DIFLUCAN) 150 MG tablet Take 1 tablet (150 mg total) by mouth daily. 09/28/19   Ripley Fraise, MD  metroNIDAZOLE (FLAGYL) 500 MG tablet Take 1 tablet (500 mg total) by mouth 2 (two) times daily. One po bid x 7 days 09/28/19   Ripley Fraise, MD      Allergies    Penicillins    Review of Systems   Negative unless otherwise stated in HPI.  Physical Exam Updated Vital Signs BP (!) 125/95 (BP Location: Right Arm)    Pulse 88    Temp 98.8 F (37.1 C) (Oral)    Resp 16    Ht 5\' 5"  (1.651 m)    Wt 87.5 kg    SpO2 98%    BMI 32.12 kg/m  Constitutional: No acute distress noted. Ears: Bilateral TM, canal normal.  No tenderness noted. Nose: Subjective congestion.  Negative for rhinorrhea, bilateral sinus tenderness.  Positive bilateral swollen nasal turbinates. Mouth/throat: Pharynx negative for swelling, erythema, exudate.  Negative for tonsillar exudate or abscess. Neck: Positive for submandibular lymphadenopathy. Cardio: Regular rate and rhythm.  No murmurs, rubs, gallops. Pulm: Clear to auscultation bilaterally.  Normal work of breathing on room air. Abdomen: Soft, nontender, nondistended. MSK: Negative for extremity edema.  Patient reports mild tenderness to palpation of bilateral ankles. Skin: Skin is warm and dry. Neuro: Alert and oriented x3.  No focal deficit noted. Psych: Normal mood and affect  ED Results / Procedures / Treatments   Labs (all labs ordered  are listed, but only abnormal results are displayed) Labs Reviewed  RESP PANEL BY RT-PCR (FLU A&B, COVID) ARPGX2 - Abnormal; Notable for the following components:      Result Value   SARS Coronavirus 2 by RT PCR POSITIVE (*)    All other components within normal limits  GROUP A STREP BY PCR    EKG None  Radiology No results found.  Procedures None  Medications Ordered in ED Medications - No data to display  ED Course/ Medical Decision Making/ A&P   Patient presents with nausea, vomiting, body  aches for 1 day.  Symptomatic management at home including NyQuil, promethazine, Advil has been unhelpful.    Differential concerning for influenza, COVID-19, strep pharyngitis, other viral URI.  Patient denies receiving flu vaccine or COVID-19 vaccine.  Centor criteria score of 1 given lymphadenopathy making probability of strep pharyngitis low.  Test ordered: Group A strep Influenza A and B COVID-19  Disposition: Patient found to be positive for COVID-19 infection.  She does not have risk factors that place her at high risk and I do not feel that she needs more extensive therapies at this time.  I recommend that does symptomatic management at home.  She will need to isolate for 5 days, and if she is to venture out after those 5 days she will need to wear a mask.  She is stable for discharge at this time.  I discussed this case with my attending physician Dr. Billy Fischer who cosigned this note including patient's presenting symptoms, physical exam, and planned diagnostics and intervention.  Final Clinical Impression(s) / ED Diagnoses Final diagnoses:  COVID-19    Rx / DC Orders ED Discharge Orders          Ordered    ondansetron (ZOFRAN-ODT) 4 MG disintegrating tablet  Every 8 hours PRN        12/19/21 0950    benzonatate (TESSALON) 100 MG capsule  3 times daily PRN        12/19/21 0950    cyclobenzaprine (FLEXERIL) 10 MG tablet  2 times daily PRN        12/19/21 0950              Farrel Gordon, DO 12/19/21 1001    Gareth Morgan, MD 12/19/21 2217

## 2021-12-19 NOTE — ED Triage Notes (Signed)
Pt reports sore throat and body aches for the past 2 days. Upon asking pt also reports n/v/d and neck pain. Has been able to keep down fluids.

## 2022-01-16 DIAGNOSIS — Z419 Encounter for procedure for purposes other than remedying health state, unspecified: Secondary | ICD-10-CM | POA: Diagnosis not present

## 2022-02-13 DIAGNOSIS — Z419 Encounter for procedure for purposes other than remedying health state, unspecified: Secondary | ICD-10-CM | POA: Diagnosis not present

## 2022-02-15 DIAGNOSIS — N898 Other specified noninflammatory disorders of vagina: Secondary | ICD-10-CM | POA: Diagnosis not present

## 2022-03-16 DIAGNOSIS — Z419 Encounter for procedure for purposes other than remedying health state, unspecified: Secondary | ICD-10-CM | POA: Diagnosis not present

## 2022-04-15 DIAGNOSIS — Z419 Encounter for procedure for purposes other than remedying health state, unspecified: Secondary | ICD-10-CM | POA: Diagnosis not present

## 2022-05-16 DIAGNOSIS — Z419 Encounter for procedure for purposes other than remedying health state, unspecified: Secondary | ICD-10-CM | POA: Diagnosis not present

## 2022-05-24 DIAGNOSIS — N898 Other specified noninflammatory disorders of vagina: Secondary | ICD-10-CM | POA: Diagnosis not present

## 2022-06-15 DIAGNOSIS — Z419 Encounter for procedure for purposes other than remedying health state, unspecified: Secondary | ICD-10-CM | POA: Diagnosis not present

## 2022-07-16 DIAGNOSIS — Z419 Encounter for procedure for purposes other than remedying health state, unspecified: Secondary | ICD-10-CM | POA: Diagnosis not present

## 2022-08-15 DIAGNOSIS — N644 Mastodynia: Secondary | ICD-10-CM | POA: Diagnosis not present

## 2022-08-15 DIAGNOSIS — N6009 Solitary cyst of unspecified breast: Secondary | ICD-10-CM | POA: Diagnosis not present

## 2022-08-16 DIAGNOSIS — Z419 Encounter for procedure for purposes other than remedying health state, unspecified: Secondary | ICD-10-CM | POA: Diagnosis not present

## 2022-09-15 DIAGNOSIS — Z419 Encounter for procedure for purposes other than remedying health state, unspecified: Secondary | ICD-10-CM | POA: Diagnosis not present

## 2022-09-17 DIAGNOSIS — Z13228 Encounter for screening for other metabolic disorders: Secondary | ICD-10-CM | POA: Diagnosis not present

## 2022-09-17 DIAGNOSIS — Z13220 Encounter for screening for lipoid disorders: Secondary | ICD-10-CM | POA: Diagnosis not present

## 2022-09-17 DIAGNOSIS — Z79899 Other long term (current) drug therapy: Secondary | ICD-10-CM | POA: Diagnosis not present

## 2022-09-17 DIAGNOSIS — Z0000 Encounter for general adult medical examination without abnormal findings: Secondary | ICD-10-CM | POA: Diagnosis not present

## 2022-09-17 DIAGNOSIS — Z130 Encounter for screening for diseases of the blood and blood-forming organs and certain disorders involving the immune mechanism: Secondary | ICD-10-CM | POA: Diagnosis not present

## 2022-09-30 DIAGNOSIS — Z23 Encounter for immunization: Secondary | ICD-10-CM | POA: Diagnosis not present

## 2022-09-30 DIAGNOSIS — N898 Other specified noninflammatory disorders of vagina: Secondary | ICD-10-CM | POA: Diagnosis not present

## 2022-09-30 DIAGNOSIS — Z124 Encounter for screening for malignant neoplasm of cervix: Secondary | ICD-10-CM | POA: Diagnosis not present

## 2022-09-30 DIAGNOSIS — Z111 Encounter for screening for respiratory tuberculosis: Secondary | ICD-10-CM | POA: Diagnosis not present

## 2022-09-30 DIAGNOSIS — Z0184 Encounter for antibody response examination: Secondary | ICD-10-CM | POA: Diagnosis not present

## 2022-10-16 DIAGNOSIS — Z419 Encounter for procedure for purposes other than remedying health state, unspecified: Secondary | ICD-10-CM | POA: Diagnosis not present

## 2022-11-15 DIAGNOSIS — Z419 Encounter for procedure for purposes other than remedying health state, unspecified: Secondary | ICD-10-CM | POA: Diagnosis not present

## 2022-12-16 DIAGNOSIS — Z419 Encounter for procedure for purposes other than remedying health state, unspecified: Secondary | ICD-10-CM | POA: Diagnosis not present

## 2023-01-01 DIAGNOSIS — F4323 Adjustment disorder with mixed anxiety and depressed mood: Secondary | ICD-10-CM | POA: Diagnosis not present

## 2023-01-16 DIAGNOSIS — Z419 Encounter for procedure for purposes other than remedying health state, unspecified: Secondary | ICD-10-CM | POA: Diagnosis not present

## 2023-02-09 ENCOUNTER — Other Ambulatory Visit: Payer: Self-pay

## 2023-02-09 ENCOUNTER — Emergency Department (HOSPITAL_BASED_OUTPATIENT_CLINIC_OR_DEPARTMENT_OTHER)
Admission: EM | Admit: 2023-02-09 | Discharge: 2023-02-09 | Disposition: A | Discharge status: Home / Self Care | Payer: Medicaid Other | Attending: Emergency Medicine | Admitting: Emergency Medicine

## 2023-02-09 DIAGNOSIS — J020 Streptococcal pharyngitis: Secondary | ICD-10-CM | POA: Insufficient documentation

## 2023-02-09 DIAGNOSIS — Z20822 Contact with and (suspected) exposure to covid-19: Secondary | ICD-10-CM | POA: Insufficient documentation

## 2023-02-09 DIAGNOSIS — J029 Acute pharyngitis, unspecified: Secondary | ICD-10-CM | POA: Diagnosis present

## 2023-02-09 LAB — RESP PANEL BY RT-PCR (RSV, FLU A&B, COVID)  RVPGX2
Influenza A by PCR: NEGATIVE
Influenza B by PCR: NEGATIVE
Resp Syncytial Virus by PCR: NEGATIVE
SARS Coronavirus 2 by RT PCR: NEGATIVE

## 2023-02-09 LAB — GROUP A STREP BY PCR: Group A Strep by PCR: DETECTED — AB

## 2023-02-09 MED ORDER — CEPHALEXIN 250 MG PO CAPS
1000.0000 mg | ORAL_CAPSULE | Freq: Once | ORAL | Status: AC
  Administered 2023-02-09: 1000 mg via ORAL
  Filled 2023-02-09: qty 4

## 2023-02-09 MED ORDER — ACETAMINOPHEN 500 MG PO TABS
1000.0000 mg | ORAL_TABLET | Freq: Once | ORAL | Status: AC
  Administered 2023-02-09: 1000 mg via ORAL
  Filled 2023-02-09: qty 2

## 2023-02-09 MED ORDER — CEFIXIME 400 MG PO CAPS: 400.0000 mg | ORAL_CAPSULE | Freq: Every day | ORAL | 0 refills | Status: DC

## 2023-02-09 MED ORDER — DEXAMETHASONE 10 MG/ML FOR PEDIATRIC ORAL USE
10.0000 mg | Freq: Once | INTRAMUSCULAR | Status: AC
  Administered 2023-02-09: 10 mg via ORAL
  Filled 2023-02-09: qty 1

## 2023-02-09 NOTE — ED Provider Notes (Signed)
[place] [facility] Provider Note   CSN: [id number] Arrival date & time: 02/09/23  1919     History  Chief Complaint  Patient presents with   Sore Throat    Sandra French is a 32 y.o. female with noncontributory past medical history presents with sore throat that started this morning.  She denies any fever, chills, she reports some pain with swallowing.  Patient reports that she gets strep throat around yearly.  She denies any inability to tolerate her own secretions.   Sore Throat       Home Medications Prior to Admission medications   Medication Sig Start Date End Date Taking? Authorizing Provider  cefixime (SUPRAX) 400 MG CAPS capsule Take 1 capsule (400 mg total) by mouth daily. 02/09/23  Yes Charmaine Placido H, PA-C  acetaminophen (TYLENOL) 325 MG tablet Take 2 tablets (650 mg total) by mouth every 6 (six) hours as needed. Do not take more than '4000mg'$  of tylenol per day 07/13/19   Couture, Cortni S, PA-C  benzonatate (TESSALON) 100 MG capsule Take 1 capsule (100 mg total) by mouth 3 (three) times daily as needed for cough. 12/19/21   Gareth Morgan, MD  cyclobenzaprine (FLEXERIL) 10 MG tablet Take 1 tablet (10 mg total) by mouth 2 (two) times daily as needed for muscle spasms. 12/19/21   Gareth Morgan, MD  fluconazole (DIFLUCAN) 150 MG tablet Take 1 tablet (150 mg total) by mouth daily. 09/28/19   Ripley Fraise, MD  metroNIDAZOLE (FLAGYL) 500 MG tablet Take 1 tablet (500 mg total) by mouth 2 (two) times daily. One po bid x 7 days 09/28/19   Ripley Fraise, MD  ondansetron (ZOFRAN-ODT) 4 MG disintegrating tablet Take 1 tablet (4 mg total) by mouth every 8 (eight) hours as needed for nausea or vomiting. 12/19/21   Gareth Morgan, MD      Allergies    Penicillins    Review of Systems   Review of Systems  HENT:  Positive for sore throat.   All other systems reviewed and are negative.   Physical Exam Updated Vital Signs BP  (!) 139/107 (BP Location: Left Arm)   Pulse 93   Temp 99 F (37.2 C) (Oral)   Resp 15   Wt 84.4 kg   SpO2 93%   BMI 30.95 kg/m  Physical Exam Vitals and nursing note reviewed.  Constitutional:      General: She is not in acute distress.    Appearance: Normal appearance.  HENT:     Head: Normocephalic and atraumatic.     Mouth/Throat:     Comments: moderate significant posterior oropharynx erythema, no swelling, mild exudate. Uvula midline, tonsils 2+ bilaterally.  No trismus, stridor, evidence of PTA, floor of mouth swelling or redness.   Eyes:     General:        Right eye: No discharge.        Left eye: No discharge.  Cardiovascular:     Rate and Rhythm: Normal rate and regular rhythm.     Heart sounds: No murmur heard.    No friction rub. No gallop.  Pulmonary:     Effort: Pulmonary effort is normal.     Breath sounds: Normal breath sounds.  Abdominal:     General: Bowel sounds are normal.     Palpations: Abdomen is soft.  Skin:    General: Skin is warm and dry.     Capillary Refill: Capillary refill takes less than 2 seconds.  Neurological:     Mental Status: She is alert and oriented to person, place, and time.  Psychiatric:        Mood and Affect: Mood normal.        Behavior: Behavior normal.     ED Results / Procedures / Treatments   Labs (all labs ordered are listed, but only abnormal results are displayed) Labs Reviewed  GROUP A STREP BY PCR - Abnormal; Notable for the following components:      Result Value   Group A Strep by PCR DETECTED (*)    All other components within normal limits  RESP PANEL BY RT-PCR (RSV, FLU A&B, COVID)  RVPGX2    EKG None  Radiology No results found.  Procedures Procedures    Medications Ordered in ED Medications  dexamethasone (DECADRON) 10 MG/ML injection for Pediatric ORAL use 10 mg (10 mg Oral Given 02/09/23 2100)  acetaminophen (TYLENOL) tablet 1,000 mg (1,000 mg Oral Given 02/09/23 2103)  cephALEXin  (KEFLEX) capsule 1,000 mg (1,000 mg Oral Given 02/09/23 2102)    ED Course/ Medical Decision Making/ A&P                             Medical Decision Making Risk OTC drugs. Prescription drug management.   This is a well-appearing 32yo female who presents with concern for 1 days of sore throat.  My emergent differential diagnosis includes acute upper respiratory infection with COVID, flu, RSV versus new asthma presentation, acute bronchitis, less clinical concern for pneumonia.  Also considered other ENT emergencies, Ludwig angina, strep pharyngitis, mono, versus epiglottis, tonsillitis versus other.  Most concerned for strep or other pharyngitis based on my initial evaluation. This is not an exhaustive differential.  On my exam patient is overall well-appearing, they have temperature of 99, breathing unlabored, no tachypnea, no respiratory distress, stable oxygen saturation.  Patient without tachycardia. RVP independently reviewed by myself shows no covid, flu, rsv. Strep PCR positive for strep B.  Patient symptoms are consistent with strep pharyngitis.  Encouraged ibuprofen, Tylenol, rest, plenty of fluids. will treat with Decadron, and antibiotics. discussed extensive return precautions.  Patient discharged in stable condition at this time.  Final Clinical Impression(s) / ED Diagnoses Final diagnoses:  Strep pharyngitis    Rx / DC Orders ED Discharge Orders          Ordered    cefixime (SUPRAX) 400 MG CAPS capsule  Daily        02/09/23 2056              Dorien Chihuahua 02/09/23 2118    Gareth Morgan, MD 02/10/23 1140

## 2023-02-09 NOTE — ED Triage Notes (Signed)
Pt arrives with c/o sore throat that started this morning. Pt denies fevers. Pt endorses painful swallowing.

## 2023-02-09 NOTE — ED Notes (Signed)
Discharge paperwork reviewed entirely with patient, including Rx's and follow up care. Pain was under control. Pt verbalized understanding as well as all parties involved. No questions or concerns voiced at the time of discharge. No acute distress noted.   Pt ambulated out to PVA without incident or assistance.

## 2023-02-14 DIAGNOSIS — Z419 Encounter for procedure for purposes other than remedying health state, unspecified: Secondary | ICD-10-CM | POA: Diagnosis not present

## 2023-03-17 DIAGNOSIS — Z419 Encounter for procedure for purposes other than remedying health state, unspecified: Secondary | ICD-10-CM | POA: Diagnosis not present

## 2023-04-02 DIAGNOSIS — F4323 Adjustment disorder with mixed anxiety and depressed mood: Secondary | ICD-10-CM | POA: Diagnosis not present

## 2023-04-07 DIAGNOSIS — E049 Nontoxic goiter, unspecified: Secondary | ICD-10-CM | POA: Diagnosis not present

## 2023-04-07 DIAGNOSIS — G4733 Obstructive sleep apnea (adult) (pediatric): Secondary | ICD-10-CM | POA: Diagnosis not present

## 2023-04-07 DIAGNOSIS — J3503 Chronic tonsillitis and adenoiditis: Secondary | ICD-10-CM | POA: Diagnosis not present

## 2023-04-16 DIAGNOSIS — Z419 Encounter for procedure for purposes other than remedying health state, unspecified: Secondary | ICD-10-CM | POA: Diagnosis not present

## 2023-04-17 DIAGNOSIS — Z0389 Encounter for observation for other suspected diseases and conditions ruled out: Secondary | ICD-10-CM | POA: Diagnosis not present

## 2023-04-17 DIAGNOSIS — E049 Nontoxic goiter, unspecified: Secondary | ICD-10-CM | POA: Diagnosis not present

## 2023-05-17 DIAGNOSIS — Z419 Encounter for procedure for purposes other than remedying health state, unspecified: Secondary | ICD-10-CM | POA: Diagnosis not present

## 2023-05-21 DIAGNOSIS — Z23 Encounter for immunization: Secondary | ICD-10-CM | POA: Diagnosis not present

## 2023-05-29 DIAGNOSIS — G4733 Obstructive sleep apnea (adult) (pediatric): Secondary | ICD-10-CM | POA: Diagnosis not present

## 2023-06-04 DIAGNOSIS — G4733 Obstructive sleep apnea (adult) (pediatric): Secondary | ICD-10-CM | POA: Diagnosis not present

## 2023-06-05 ENCOUNTER — Emergency Department (HOSPITAL_BASED_OUTPATIENT_CLINIC_OR_DEPARTMENT_OTHER)
Admission: EM | Admit: 2023-06-05 | Discharge: 2023-06-05 | Discharge status: Against Medical Advice | Payer: Medicaid Other | Attending: Emergency Medicine | Admitting: Emergency Medicine

## 2023-06-05 ENCOUNTER — Emergency Department (HOSPITAL_BASED_OUTPATIENT_CLINIC_OR_DEPARTMENT_OTHER): Payer: Medicaid Other

## 2023-06-05 ENCOUNTER — Encounter (HOSPITAL_BASED_OUTPATIENT_CLINIC_OR_DEPARTMENT_OTHER): Payer: Self-pay

## 2023-06-05 ENCOUNTER — Other Ambulatory Visit: Payer: Self-pay

## 2023-06-05 ENCOUNTER — Emergency Department (HOSPITAL_BASED_OUTPATIENT_CLINIC_OR_DEPARTMENT_OTHER)
Admission: EM | Admit: 2023-06-05 | Discharge: 2023-06-05 | Discharge status: Against Medical Advice | Payer: Medicaid Other | Source: Home / Self Care | Attending: Emergency Medicine | Admitting: Emergency Medicine

## 2023-06-05 DIAGNOSIS — Z87440 Personal history of urinary (tract) infections: Secondary | ICD-10-CM | POA: Insufficient documentation

## 2023-06-05 DIAGNOSIS — R079 Chest pain, unspecified: Secondary | ICD-10-CM | POA: Diagnosis not present

## 2023-06-05 DIAGNOSIS — R0602 Shortness of breath: Secondary | ICD-10-CM | POA: Diagnosis not present

## 2023-06-05 DIAGNOSIS — Z5321 Procedure and treatment not carried out due to patient leaving prior to being seen by health care provider: Secondary | ICD-10-CM | POA: Insufficient documentation

## 2023-06-05 DIAGNOSIS — M549 Dorsalgia, unspecified: Secondary | ICD-10-CM | POA: Insufficient documentation

## 2023-06-05 DIAGNOSIS — Z5329 Procedure and treatment not carried out because of patient's decision for other reasons: Secondary | ICD-10-CM | POA: Diagnosis not present

## 2023-06-05 DIAGNOSIS — M546 Pain in thoracic spine: Secondary | ICD-10-CM | POA: Diagnosis present

## 2023-06-05 DIAGNOSIS — R0789 Other chest pain: Secondary | ICD-10-CM | POA: Insufficient documentation

## 2023-06-05 LAB — CBC WITH DIFFERENTIAL/PLATELET
Abs Immature Granulocytes: 0.02 10*3/uL (ref 0.00–0.07)
Basophils Absolute: 0 10*3/uL (ref 0.0–0.1)
Basophils Relative: 0 %
Eosinophils Absolute: 0.1 10*3/uL (ref 0.0–0.5)
Eosinophils Relative: 1 %
HCT: 39 % (ref 36.0–46.0)
Hemoglobin: 12.9 g/dL (ref 12.0–15.0)
Immature Granulocytes: 0 %
Lymphocytes Relative: 32 %
Lymphs Abs: 2.9 10*3/uL (ref 0.7–4.0)
MCH: 29.2 pg (ref 26.0–34.0)
MCHC: 33.1 g/dL (ref 30.0–36.0)
MCV: 88.2 fL (ref 80.0–100.0)
Monocytes Absolute: 0.5 10*3/uL (ref 0.1–1.0)
Monocytes Relative: 6 %
Neutro Abs: 5.6 10*3/uL (ref 1.7–7.7)
Neutrophils Relative %: 61 %
Platelets: 224 10*3/uL (ref 150–400)
RBC: 4.42 MIL/uL (ref 3.87–5.11)
RDW: 12.9 % (ref 11.5–15.5)
WBC: 9.1 10*3/uL (ref 4.0–10.5)
nRBC: 0 % (ref 0.0–0.2)

## 2023-06-05 LAB — URINALYSIS, ROUTINE W REFLEX MICROSCOPIC
Bilirubin Urine: NEGATIVE
Glucose, UA: NEGATIVE mg/dL
Hgb urine dipstick: NEGATIVE
Ketones, ur: 40 mg/dL — AB
Leukocytes,Ua: NEGATIVE
Nitrite: NEGATIVE
Protein, ur: NEGATIVE mg/dL
Specific Gravity, Urine: 1.025 (ref 1.005–1.030)
pH: 7 (ref 5.0–8.0)

## 2023-06-05 LAB — BASIC METABOLIC PANEL
Anion gap: 5 (ref 5–15)
BUN: 10 mg/dL (ref 6–20)
CO2: 26 mmol/L (ref 22–32)
Calcium: 8.6 mg/dL — ABNORMAL LOW (ref 8.9–10.3)
Chloride: 105 mmol/L (ref 98–111)
Creatinine, Ser: 0.84 mg/dL (ref 0.44–1.00)
GFR, Estimated: >60 mL/min (ref >60)
Glucose, Bld: 91 mg/dL (ref 70–99)
Potassium: 4 mmol/L (ref 3.5–5.1)
Sodium: 136 mmol/L (ref 135–145)

## 2023-06-05 LAB — PREGNANCY, URINE: Preg Test, Ur: NEGATIVE

## 2023-06-05 MED ORDER — KETOROLAC TROMETHAMINE 15 MG/ML IJ SOLN
15.0000 mg | Freq: Once | INTRAMUSCULAR | Status: AC
  Administered 2023-06-05: 15 mg via INTRAMUSCULAR
  Filled 2023-06-05: qty 1

## 2023-06-05 MED ORDER — ACETAMINOPHEN 500 MG PO TABS
1000.0000 mg | ORAL_TABLET | Freq: Once | ORAL | Status: AC
  Administered 2023-06-05: 1000 mg via ORAL
  Filled 2023-06-05: qty 2

## 2023-06-05 NOTE — ED Triage Notes (Signed)
Pt reports she woke up yesterday morning with upper back pain and then today she started having chest pain.   Pt was triaged earlier this evening but left after triage to run some errands. Labs and EKG done at that visit.

## 2023-06-05 NOTE — ED Provider Notes (Signed)
[place] [facility] Provider Note   CSN: [id number] Arrival date & time: 06/05/23  1856     History Chief Complaint  Patient presents with   Back Pain   Chest Pain    HPI Sandra French is a 32 y.o. female presenting for chest and back pain.  States it started earlier today.  States that she has a history of UTIs and it felt similar.  She denies fevers chills nausea vomiting shortness of breath   Patient's recorded medical, surgical, social, medication list and allergies were reviewed in the Snapshot window as part of the initial history.   Review of Systems   Review of Systems  Constitutional:  Negative for chills and fever.  HENT:  Negative for ear pain and sore throat.   Eyes:  Negative for pain and visual disturbance.  Respiratory:  Negative for cough and shortness of breath.   Cardiovascular:  Negative for chest pain and palpitations.  Gastrointestinal:  Negative for abdominal pain and vomiting.  Genitourinary:  Negative for dysuria and hematuria.  Musculoskeletal:  Positive for back pain. Negative for arthralgias.  Skin:  Negative for color change and rash.  Neurological:  Negative for seizures and syncope.  All other systems reviewed and are negative.   Physical Exam Updated Vital Signs BP (!) 138/94   Pulse 67   Temp (!) 97.3 F (36.3 C)   Resp 16   Ht 5\' 3"  (1.6 m)   Wt 83.9 kg   LMP 05/15/2023   SpO2 99%   BMI 32.77 kg/m  Physical Exam Vitals and nursing note reviewed.  Constitutional:      General: She is not in acute distress.    Appearance: She is well-developed.  HENT:     Head: Normocephalic and atraumatic.  Eyes:     Conjunctiva/sclera: Conjunctivae normal.  Cardiovascular:     Rate and Rhythm: Normal rate and regular rhythm.     Heart sounds: No murmur heard. Pulmonary:     Effort: Pulmonary effort is normal. No respiratory distress.     Breath sounds: Normal breath sounds.  Abdominal:      General: There is no distension.     Palpations: Abdomen is soft.     Tenderness: There is no abdominal tenderness. There is no right CVA tenderness or left CVA tenderness.  Musculoskeletal:        General: No swelling or tenderness. Normal range of motion.     Cervical back: Neck supple.  Skin:    General: Skin is warm and dry.  Neurological:     General: No focal deficit present.     Mental Status: She is alert and oriented to person, place, and time. Mental status is at baseline.     Cranial Nerves: No cranial nerve deficit.      ED Course/ Medical Decision Making/ A&P    Procedures Procedures   Medications Ordered in ED Medications  ketorolac (TORADOL) 15 MG/ML injection 15 mg (15 mg Intramuscular Given 06/05/23 2121)  acetaminophen (TYLENOL) tablet 1,000 mg (1,000 mg Oral Given 06/05/23 2121)    Medical Decision Making:   Patient presenting with chest and back pain.  Broad differential including ACS, pulmonary embolism, pneumonia, pneumothorax , Pyelonephritis. Extensive evaluation was initially including radiographic and laboratory evaluation and grossly nondiagnostic.  Went to reevaluate patient after therapeutic management but when I return for reassessment I was informed that patient eloped from the emergency department.  Clinical Impression:  1. Chest  wall pain      Eloped   Final Clinical Impression(s) / ED Diagnoses Final diagnoses:  Chest wall pain    Rx / DC Orders ED Discharge Orders     None         Glyn Ade, MD 06/05/23 2353

## 2023-06-05 NOTE — ED Notes (Signed)
Yesterday, acute onset when woke up, upper back pain. Tried ibuprofen and hydrocodone from previous procedure, only got 30 minute relief. No diaphoresis, 1x week of SOB, no asthma, yes smoker.   No dizziness, Positive headache, feels like sinus pressure. No recent illness, strep 1x month ago. Distal PMS intact.

## 2023-06-05 NOTE — ED Notes (Addendum)
Went to check on patient and room was empty, all belongings gone too, reg verified pt had left

## 2023-06-05 NOTE — ED Notes (Signed)
Informed nurse that she would be back in 45 min. Was called to go back to Room 6

## 2023-06-05 NOTE — ED Triage Notes (Signed)
Woke up with upper back pain. Reports SOB. States she has fell out from the pain.

## 2023-06-05 NOTE — ED Notes (Signed)
Reproducible pain with palpation to med scapular region, and upper extremity ROM.

## 2023-06-16 DIAGNOSIS — Z419 Encounter for procedure for purposes other than remedying health state, unspecified: Secondary | ICD-10-CM | POA: Diagnosis not present

## 2023-06-25 ENCOUNTER — Emergency Department (HOSPITAL_BASED_OUTPATIENT_CLINIC_OR_DEPARTMENT_OTHER): Payer: Medicaid Other

## 2023-06-25 ENCOUNTER — Emergency Department (HOSPITAL_BASED_OUTPATIENT_CLINIC_OR_DEPARTMENT_OTHER)
Admission: EM | Admit: 2023-06-25 | Discharge: 2023-06-25 | Disposition: A | Discharge status: Home / Self Care | Payer: Medicaid Other | Attending: Emergency Medicine | Admitting: Emergency Medicine

## 2023-06-25 ENCOUNTER — Encounter (HOSPITAL_BASED_OUTPATIENT_CLINIC_OR_DEPARTMENT_OTHER): Payer: Self-pay

## 2023-06-25 ENCOUNTER — Other Ambulatory Visit: Payer: Self-pay

## 2023-06-25 DIAGNOSIS — M940 Chondrocostal junction syndrome [Tietze]: Secondary | ICD-10-CM | POA: Diagnosis not present

## 2023-06-25 DIAGNOSIS — R1011 Right upper quadrant pain: Secondary | ICD-10-CM | POA: Diagnosis not present

## 2023-06-25 DIAGNOSIS — R079 Chest pain, unspecified: Secondary | ICD-10-CM | POA: Diagnosis not present

## 2023-06-25 LAB — TROPONIN I (HIGH SENSITIVITY)
Troponin I (High Sensitivity): <2 ng/L (ref <18)
Troponin I (High Sensitivity): <2 ng/L (ref <18)

## 2023-06-25 LAB — CBC
HCT: 40.7 % (ref 36.0–46.0)
Hemoglobin: 13.6 g/dL (ref 12.0–15.0)
MCH: 28.7 pg (ref 26.0–34.0)
MCHC: 33.4 g/dL (ref 30.0–36.0)
MCV: 85.9 fL (ref 80.0–100.0)
Platelets: 297 10*3/uL (ref 150–400)
RBC: 4.74 MIL/uL (ref 3.87–5.11)
RDW: 12.8 % (ref 11.5–15.5)
WBC: 5.7 10*3/uL (ref 4.0–10.5)
nRBC: 0 % (ref 0.0–0.2)

## 2023-06-25 LAB — BASIC METABOLIC PANEL
Anion gap: 8 (ref 5–15)
BUN: 14 mg/dL (ref 6–20)
CO2: 23 mmol/L (ref 22–32)
Calcium: 9 mg/dL (ref 8.9–10.3)
Chloride: 104 mmol/L (ref 98–111)
Creatinine, Ser: 0.79 mg/dL (ref 0.44–1.00)
GFR, Estimated: >60 mL/min (ref >60)
Glucose, Bld: 106 mg/dL — ABNORMAL HIGH (ref 70–99)
Potassium: 3.9 mmol/L (ref 3.5–5.1)
Sodium: 135 mmol/L (ref 135–145)

## 2023-06-25 LAB — PREGNANCY, URINE: Preg Test, Ur: NEGATIVE

## 2023-06-25 LAB — HEPATIC FUNCTION PANEL
ALT: 19 U/L (ref 0–44)
AST: 16 U/L (ref 15–41)
Albumin: 4.1 g/dL (ref 3.5–5.0)
Alkaline Phosphatase: 42 U/L (ref 38–126)
Bilirubin, Direct: <0.1 mg/dL (ref 0.0–0.2)
Total Bilirubin: 0.3 mg/dL (ref 0.3–1.2)
Total Protein: 7.5 g/dL (ref 6.5–8.1)

## 2023-06-25 LAB — LIPASE, BLOOD: Lipase: 30 U/L (ref 11–51)

## 2023-06-25 MED ORDER — KETOROLAC TROMETHAMINE 15 MG/ML IJ SOLN
15.0000 mg | Freq: Once | INTRAMUSCULAR | Status: AC
  Administered 2023-06-25: 15 mg via INTRAVENOUS
  Filled 2023-06-25: qty 1

## 2023-06-25 NOTE — ED Triage Notes (Signed)
Pt reports to ED via POV with complaints of chest pain that started yesterday afternoon.

## 2023-06-25 NOTE — ED Provider Notes (Signed)
[place] [facility] Provider Note   CSN: [id number] Arrival date & time: 06/25/23  0730    History Chief Complaint  Patient presents with   Chest Pain     Chest Pain Associated symptoms: abdominal pain and shortness of breath   Associated symptoms: no cough, no diaphoresis, no fever, no nausea and no vomiting    Sandra French is a 32 y.o. female otherwise healthy presenting for chest pain that is grippling and radiates to her back left shoulder. It started yesterday. No significant heart disease in family.  She was SOB Monday and has had intermittent RUQ abdominal pain. States that she has been under more stress than usual. Drinks caffeine sparingly. Not on OCPs (has IUD)  Patient's recorded medical, surgical, social, medication list and allergies were reviewed in the Snapshot window as part of the initial history.   Review of Systems   Review of Systems  Constitutional:  Positive for chills. Negative for diaphoresis and fever.  Respiratory:  Positive for chest tightness and shortness of breath. Negative for cough.   Cardiovascular:  Positive for chest pain.  Gastrointestinal:  Positive for abdominal pain. Negative for abdominal distention, diarrhea, nausea and vomiting.  Genitourinary:  Negative for difficulty urinating and frequency.    Physical Exam Updated Vital Signs BP (!) 154/106 (BP Location: Right Arm)   Pulse 72   Temp 98.2 F (36.8 C) (Oral)   Resp 14   Ht 5\' 5"  (1.651 m)   Wt 86.2 kg   LMP 05/17/2023 (Approximate)   SpO2 98%   BMI 31.62 kg/m   Physical Exam Constitutional:      General: She is not in acute distress. Cardiovascular:     Heart sounds: Heart sounds not distant. No murmur heard.    No friction rub. No gallop.  Pulmonary:     Effort: No tachypnea or respiratory distress.  Chest:     Chest wall: Tenderness present. No crepitus.     Comments: Reproducible upon palpation. Neurological:     Mental  Status: She is alert.    ED Course/ Medical Decision Making/ A&P   Procedures Procedures   Medications Ordered in ED Medications  ketorolac (TORADOL) 15 MG/ML injection 15 mg (15 mg Intravenous Given 06/25/23 0842)    Medical Decision Making:    Complete initial physical exam performed, notably the patient had chest pain that was reproducible upon palpation and RUQ pain.   Reviewed and confirmed nursing documentation for past medical history, family history, social history.    Initial Assessment:   With the patient's presentation of chest pain, there is concern for an MI, costochondritis, PE, pneumonia, pancreatitis, GERD. MI less likely given age and risk factors. With her RUQ pain, there is concern of a gall stone or cholecystitis.   Initial Plan:   CXR to evaluate for structural/infectious intrathoracic pathology.  EKG to evaluate for cardiac pathology. Screening labs including CBC and Metabolic panel to evaluate for infectious or metabolic etiology of disease.  Objective evaluation as below reviewed with plan for close reassessment 5.   Tramadol for pain   Initial Study Results:   Laboratory  All laboratory results reviewed without evidence of clinically relevant pathology.    Results for orders placed or performed during the hospital encounter of 06/25/23  Basic metabolic panel  Result Value Ref Range   Sodium 135 135 - 145 mmol/L   Potassium 3.9 3.5 - 5.1 mmol/L   Chloride 104 98 - 111  mmol/L   CO2 23 22 - 32 mmol/L   Glucose, Bld 106 (H) 70 - 99 mg/dL   BUN 14 6 - 20 mg/dL   Creatinine, Ser 1.61 0.44 - 1.00 mg/dL   Calcium 9.0 8.9 - 09.6 mg/dL   GFR, Estimated >04 >54 mL/min   Anion gap 8 5 - 15  CBC  Result Value Ref Range   WBC 5.7 4.0 - 10.5 K/uL   RBC 4.74 3.87 - 5.11 MIL/uL   Hemoglobin 13.6 12.0 - 15.0 g/dL   HCT 09.8 11.9 - 14.7 %   MCV 85.9 80.0 - 100.0 fL   MCH 28.7 26.0 - 34.0 pg   MCHC 33.4 30.0 - 36.0 g/dL   RDW 82.9 56.2 - 13.0 %   Platelets  297 150 - 400 K/uL   nRBC 0.0 0.0 - 0.2 %  Pregnancy, urine  Result Value Ref Range   Preg Test, Ur NEGATIVE NEGATIVE  Hepatic function panel  Result Value Ref Range   Total Protein 7.5 6.5 - 8.1 g/dL   Albumin 4.1 3.5 - 5.0 g/dL   AST 16 15 - 41 U/L   ALT 19 0 - 44 U/L   Alkaline Phosphatase 42 38 - 126 U/L   Total Bilirubin 0.3 0.3 - 1.2 mg/dL   Bilirubin, Direct <8.6 0.0 - 0.2 mg/dL   Indirect Bilirubin NOT CALCULATED 0.3 - 0.9 mg/dL  Lipase, blood  Result Value Ref Range   Lipase 30 11 - 51 U/L  Troponin I (High Sensitivity)  Result Value Ref Range   Troponin I (High Sensitivity) <2 <18 ng/L  Troponin I (High Sensitivity)  Result Value Ref Range   Troponin I (High Sensitivity) <2 <18 ng/L   US Abdomen Limited RUQ (LIVER/GB)  Result Date: 06/25/2023 CLINICAL DATA:  Right upper quadrant abdominal pain EXAM: ULTRASOUND ABDOMEN LIMITED RIGHT UPPER QUADRANT COMPARISON:  None Available. FINDINGS: Gallbladder: Gallstones: None Sludge: None Gallbladder Wall: Within normal limits Pericholecystic fluid: None Sonographic Murphy's Sign: Negative per technologist Common bile duct: Diameter: 2 mm Liver: Parenchymal echogenicity: Within normal limits Contours: Normal Lesions: None Portal vein: Patent.  Hepatopetal flow Other: None. IMPRESSION: No significant sonographic abnormality of the liver or gallbladder. Electronically Signed   By: Acquanetta Belling M.D.   On: 06/25/2023 09:51   DG Chest 2 View  Result Date: 06/25/2023 CLINICAL DATA:  Chest pain EXAM: CHEST - 2 VIEW COMPARISON:  06/05/2023 FINDINGS: The heart size and mediastinal contours are within normal limits. Both lungs are clear. The visualized skeletal structures are unremarkable. IMPRESSION: No active cardiopulmonary disease. Electronically Signed   By: Judie Petit.  Shick M.D.   On: 06/25/2023 08:08   DG Chest 2 View  Result Date: 06/05/2023 CLINICAL DATA:  Provided history: Shortness of breath. Additional history provided: Upper back pain.  EXAM: CHEST - 2 VIEW COMPARISON:  Prior chest radiographs 04/21/2011. FINDINGS: Heart size at the upper limits of normal. No appreciable airspace consolidation. No evidence of pleural effusion or pneumothorax. No acute osseous abnormality identified. IMPRESSION: No evidence of active cardiopulmonary disease. Electronically Signed   By: Jackey Loge D.O.   On: 06/05/2023 16:01    EKG EKG was reviewed. Rate, rhythm, axis, intervals all examined and without medically relevant abnormality. ST segments without concerns for elevations.    Reassessment and Plan:    Treina Arscott Heiney is a 32 y.o. female who presented to the ED today with chest pain that radiated to the left shoulder, mild SOB, and RUQ pain. On  exam the chest pain was reproducible upon palpation and improved with toradol. EKG non-concerning. Troponins WNL. No cardiopulmonary disease on CXR. Ms. Nwosu also had RUQ crampy pain. US Abdomen did not show any abnormality of liver and gallbladder.  #Costochondritis  -use ibuprofen as needed   Clinical Impression:  1. Costochondritis      Discharge   Final Clinical Impression(s) / ED Diagnoses Final diagnoses:  Costochondritis    Rx / DC Orders ED Discharge Orders     None         Hassan Rowan, [place], MD 06/25/23 1215    Rolan Bucco, MD 06/25/23 1339

## 2023-06-25 NOTE — ED Notes (Signed)
ED Provider at bedside. 

## 2023-06-25 NOTE — Discharge Instructions (Signed)
Talke ibuprofen as needed. Follow the instructions in the bottle.  Return to ED if symptoms are not controlled with the pain meds or worsen.

## 2023-06-25 NOTE — ED Notes (Signed)
Patient transported to X-ray 

## 2023-07-17 DIAGNOSIS — Z419 Encounter for procedure for purposes other than remedying health state, unspecified: Secondary | ICD-10-CM | POA: Diagnosis not present

## 2023-07-29 DIAGNOSIS — F4323 Adjustment disorder with mixed anxiety and depressed mood: Secondary | ICD-10-CM | POA: Diagnosis not present

## 2023-08-17 DIAGNOSIS — Z419 Encounter for procedure for purposes other than remedying health state, unspecified: Secondary | ICD-10-CM | POA: Diagnosis not present

## 2023-09-16 DIAGNOSIS — Z419 Encounter for procedure for purposes other than remedying health state, unspecified: Secondary | ICD-10-CM | POA: Diagnosis not present

## 2023-09-26 ENCOUNTER — Encounter (HOSPITAL_BASED_OUTPATIENT_CLINIC_OR_DEPARTMENT_OTHER): Payer: Self-pay | Admitting: Emergency Medicine

## 2023-09-26 ENCOUNTER — Other Ambulatory Visit: Payer: Self-pay

## 2023-09-26 ENCOUNTER — Emergency Department (HOSPITAL_BASED_OUTPATIENT_CLINIC_OR_DEPARTMENT_OTHER)
Admission: EM | Admit: 2023-09-26 | Discharge: 2023-09-26 | Disposition: A | Discharge status: Home / Self Care | Payer: Medicaid Other | Attending: Emergency Medicine | Admitting: Emergency Medicine

## 2023-09-26 DIAGNOSIS — J029 Acute pharyngitis, unspecified: Secondary | ICD-10-CM | POA: Diagnosis not present

## 2023-09-26 DIAGNOSIS — Z20822 Contact with and (suspected) exposure to covid-19: Secondary | ICD-10-CM | POA: Insufficient documentation

## 2023-09-26 DIAGNOSIS — R059 Cough, unspecified: Secondary | ICD-10-CM | POA: Diagnosis present

## 2023-09-26 LAB — RESP PANEL BY RT-PCR (RSV, FLU A&B, COVID)  RVPGX2
Influenza A by PCR: NEGATIVE
Influenza B by PCR: NEGATIVE
Resp Syncytial Virus by PCR: NEGATIVE
SARS Coronavirus 2 by RT PCR: NEGATIVE

## 2023-09-26 LAB — GROUP A STREP BY PCR: Group A Strep by PCR: NOT DETECTED

## 2023-09-26 MED ORDER — CEFIXIME 400 MG PO CAPS: 400.0000 mg | ORAL_CAPSULE | Freq: Every day | ORAL | 0 refills | Status: AC

## 2023-09-26 NOTE — Discharge Instructions (Signed)
You were seen for your sore throat (pharyngitis) in the emergency department.   At home, please take Tylenol and ibuprofen for your pain.  Use throat lozenges for your sore throat.  If your strep culture comes back positive please take the Suprax that you are prescribed.  If it is negative you do not need to take the antibiotic.    Check your MyChart online for the results of any tests that had not resulted by the time you left the emergency department.   Follow-up with your primary doctor in 1 week regarding your visit.    Return immediately to the emergency department if you experience any of the following: Difficulty breathing or speaking, or any other concerning symptoms.    Thank you for visiting our Emergency Department. It was a pleasure taking care of you today.

## 2023-09-26 NOTE — ED Provider Notes (Signed)
[place] [facility] Provider Note   CSN: [id number] Arrival date & time: 09/26/23  1949     History  Chief Complaint  Patient presents with   Sore Throat    Sandra French is a 32 y.o. female.  32 year old female with a history of strep throat who presents emergency department with sore throat and cough.  Says that her cough has been nonproductive.  Started 2 days ago.  Also feels like there is a lump in the upper left side of her throat.  Says that it is sore.  No difficulty speaking or swallowing.  Thinks she may have had a low-grade fever.  No known sick contacts.  Does have a history of strep throat and was concerned about this.       Home Medications Prior to Admission medications   Medication Sig Start Date End Date Taking? Authorizing Provider  acetaminophen (TYLENOL) 325 MG tablet Take 2 tablets (650 mg total) by mouth every 6 (six) hours as needed. Do not take more than 4000mg  of tylenol per day 07/13/19   French, Sandra S, PA-C  benzonatate (TESSALON) 100 MG capsule Take 1 capsule (100 mg total) by mouth 3 (three) times daily as needed for cough. 12/19/21   Alvira Monday, MD  cefixime (SUPRAX) 400 MG CAPS capsule Take 1 capsule (400 mg total) by mouth daily for 10 days. 09/26/23 10/06/23  Rondel Baton, MD  cyclobenzaprine (FLEXERIL) 10 MG tablet Take 1 tablet (10 mg total) by mouth 2 (two) times daily as needed for muscle spasms. 12/19/21   Alvira Monday, MD  fluconazole (DIFLUCAN) 150 MG tablet Take 1 tablet (150 mg total) by mouth daily. 09/28/19   Zadie Rhine, MD  metroNIDAZOLE (FLAGYL) 500 MG tablet Take 1 tablet (500 mg total) by mouth 2 (two) times daily. One po bid x 7 days 09/28/19   Zadie Rhine, MD  ondansetron (ZOFRAN-ODT) 4 MG disintegrating tablet Take 1 tablet (4 mg total) by mouth every 8 (eight) hours as needed for nausea or vomiting. 12/19/21   Alvira Monday, MD      Allergies    Penicillins     Review of Systems   Review of Systems  Physical Exam Updated Vital Signs BP (!) 140/104 (BP Location: Right Arm)   Pulse 85   Temp 99.2 F (37.3 C) (Oral)   Resp 18   LMP 08/13/2023 (Approximate) Comment: irregular  SpO2 99%  Physical Exam Vitals and nursing note reviewed.  Constitutional:      General: She is not in acute distress.    Appearance: She is well-developed.  HENT:     Head: Normocephalic and atraumatic.     Right Ear: Tympanic membrane, ear canal and external ear normal.     Left Ear: Tympanic membrane, ear canal and external ear normal.     Nose: Nose normal.     Mouth/Throat:     Mouth: Mucous membranes are moist.     Pharynx: Posterior oropharyngeal erythema present. No oropharyngeal exudate.     Comments: Left tonsillar hypertrophy but uvula remains midline Eyes:     Extraocular Movements: Extraocular movements intact.     Conjunctiva/sclera: Conjunctivae normal.     Pupils: Pupils are equal, round, and reactive to light.  Cardiovascular:     Rate and Rhythm: Normal rate and regular rhythm.     Heart sounds: No murmur heard. Pulmonary:     Effort: Pulmonary effort is normal. No respiratory distress.  Breath sounds: Normal breath sounds.  Musculoskeletal:     Cervical back: Normal range of motion and neck supple.     Right lower leg: No edema.     Left lower leg: No edema.  Lymphadenopathy:     Cervical: Cervical adenopathy (Anterior chain on the left) present.  Skin:    General: Skin is warm and dry.  Neurological:     Mental Status: She is alert and oriented to person, place, and time. Mental status is at baseline.  Psychiatric:        Mood and Affect: Mood normal.     ED Results / Procedures / Treatments   Labs (all labs ordered are listed, but only abnormal results are displayed) Labs Reviewed  GROUP A STREP BY PCR  RESP PANEL BY RT-PCR (RSV, FLU A&B, COVID)  [id number]  CULTURE, GROUP A STREP [facility])    EKG None  Radiology No  results found.  Procedures Procedures    Medications Ordered in ED Medications - No data to display  ED Course/ Medical Decision Making/ A&P                                 Medical Decision Making Risk Prescription drug management.   Sandra French is a 32 y.o. female with comorbidities that complicate the patient evaluation including hx of strep throat who presents with throat pain and cough   Initial Ddx:  URI, PNA, viral pharyngitis, strep pharyngitis   MDM/Course:  Pt presents with cough and sore throat. Overall well appearing on exam. No wheezing or rhonchi to suggest PNA. No signs of PTA or RPA or airway compromise. Has Centor score of 2. Rapid strep and covid/flu sent which were negative. With her hx of strep pharyngitis did send throat culture but feel that her sxs are more likely 2/2 viral pharyngitis. Was given rx of abx and instructed to fill them only if her throat culture comes back positive. Return precautions discussed prior to dc.   This patient presents to the ED for concern of complaints listed in HPI, this involves an extensive number of treatment options, and is a complaint that carries with it a high risk of complications and morbidity. Disposition including potential need for admission considered.   Dispo: DC Home. Return precautions discussed including, but not limited to, those listed in the AVS. Allowed pt time to ask questions which were answered fully prior to dc.  Records reviewed Outpatient Clinic Notes I have reviewed the patients home medications and made adjustments as needed   Portions of this note were generated with Dragon dictation software. Dictation errors may occur despite best attempts at proofreading.           Final Clinical Impression(s) / ED Diagnoses Final diagnoses:  Pharyngitis, unspecified etiology    Rx / DC Orders ED Discharge Orders          Ordered    cefixime (SUPRAX) 400 MG CAPS capsule  Daily         09/26/23 2143              Rondel Baton, MD 09/27/23 1015

## 2023-09-26 NOTE — ED Triage Notes (Signed)
Pt reports sore throat x 2d, reports freq bouts of strep throat and this feels similar, denies trouble swallowing or ShoB

## 2023-09-29 LAB — CULTURE, GROUP A STREP (THRC)

## 2023-10-08 DIAGNOSIS — Z111 Encounter for screening for respiratory tuberculosis: Secondary | ICD-10-CM | POA: Diagnosis not present

## 2023-10-17 DIAGNOSIS — Z419 Encounter for procedure for purposes other than remedying health state, unspecified: Secondary | ICD-10-CM | POA: Diagnosis not present

## 2023-10-28 DIAGNOSIS — Z23 Encounter for immunization: Secondary | ICD-10-CM | POA: Diagnosis not present

## 2023-10-28 DIAGNOSIS — Z111 Encounter for screening for respiratory tuberculosis: Secondary | ICD-10-CM | POA: Diagnosis not present

## 2023-11-16 DIAGNOSIS — Z419 Encounter for procedure for purposes other than remedying health state, unspecified: Secondary | ICD-10-CM | POA: Diagnosis not present

## 2023-12-17 DIAGNOSIS — Z419 Encounter for procedure for purposes other than remedying health state, unspecified: Secondary | ICD-10-CM | POA: Diagnosis not present

## 2024-01-17 DIAGNOSIS — Z419 Encounter for procedure for purposes other than remedying health state, unspecified: Secondary | ICD-10-CM | POA: Diagnosis not present

## 2024-01-28 DIAGNOSIS — Z419 Encounter for procedure for purposes other than remedying health state, unspecified: Secondary | ICD-10-CM | POA: Diagnosis not present

## 2024-02-10 DIAGNOSIS — N898 Other specified noninflammatory disorders of vagina: Secondary | ICD-10-CM | POA: Diagnosis not present

## 2024-02-10 DIAGNOSIS — B9689 Other specified bacterial agents as the cause of diseases classified elsewhere: Secondary | ICD-10-CM | POA: Diagnosis not present

## 2024-02-10 DIAGNOSIS — Z3202 Encounter for pregnancy test, result negative: Secondary | ICD-10-CM | POA: Diagnosis not present

## 2024-02-10 DIAGNOSIS — N760 Acute vaginitis: Secondary | ICD-10-CM | POA: Diagnosis not present

## 2024-02-14 DIAGNOSIS — Z419 Encounter for procedure for purposes other than remedying health state, unspecified: Secondary | ICD-10-CM | POA: Diagnosis not present

## 2024-02-25 DIAGNOSIS — Z419 Encounter for procedure for purposes other than remedying health state, unspecified: Secondary | ICD-10-CM | POA: Diagnosis not present

## 2024-03-18 DIAGNOSIS — B9689 Other specified bacterial agents as the cause of diseases classified elsewhere: Secondary | ICD-10-CM | POA: Diagnosis not present

## 2024-03-18 DIAGNOSIS — N760 Acute vaginitis: Secondary | ICD-10-CM | POA: Diagnosis not present

## 2024-03-18 DIAGNOSIS — Z30432 Encounter for removal of intrauterine contraceptive device: Secondary | ICD-10-CM | POA: Diagnosis not present

## 2024-03-27 DIAGNOSIS — Z419 Encounter for procedure for purposes other than remedying health state, unspecified: Secondary | ICD-10-CM | POA: Diagnosis not present

## 2024-04-09 DIAGNOSIS — M545 Low back pain, unspecified: Secondary | ICD-10-CM | POA: Diagnosis not present

## 2024-04-26 DIAGNOSIS — Z419 Encounter for procedure for purposes other than remedying health state, unspecified: Secondary | ICD-10-CM | POA: Diagnosis not present

## 2024-05-27 DIAGNOSIS — Z419 Encounter for procedure for purposes other than remedying health state, unspecified: Secondary | ICD-10-CM | POA: Diagnosis not present

## 2024-06-01 DIAGNOSIS — B9689 Other specified bacterial agents as the cause of diseases classified elsewhere: Secondary | ICD-10-CM | POA: Diagnosis not present

## 2024-06-01 DIAGNOSIS — Z30013 Encounter for initial prescription of injectable contraceptive: Secondary | ICD-10-CM | POA: Diagnosis not present

## 2024-06-01 DIAGNOSIS — Z30432 Encounter for removal of intrauterine contraceptive device: Secondary | ICD-10-CM | POA: Diagnosis not present

## 2024-06-01 DIAGNOSIS — N760 Acute vaginitis: Secondary | ICD-10-CM | POA: Diagnosis not present

## 2024-06-26 DIAGNOSIS — Z419 Encounter for procedure for purposes other than remedying health state, unspecified: Secondary | ICD-10-CM | POA: Diagnosis not present

## 2024-07-27 DIAGNOSIS — Z419 Encounter for procedure for purposes other than remedying health state, unspecified: Secondary | ICD-10-CM | POA: Diagnosis not present

## 2024-07-29 DIAGNOSIS — Z124 Encounter for screening for malignant neoplasm of cervix: Secondary | ICD-10-CM | POA: Diagnosis not present

## 2024-07-29 DIAGNOSIS — F4323 Adjustment disorder with mixed anxiety and depressed mood: Secondary | ICD-10-CM | POA: Diagnosis not present

## 2024-07-29 DIAGNOSIS — Z13220 Encounter for screening for lipoid disorders: Secondary | ICD-10-CM | POA: Diagnosis not present

## 2024-07-29 DIAGNOSIS — Z131 Encounter for screening for diabetes mellitus: Secondary | ICD-10-CM | POA: Diagnosis not present

## 2024-07-29 DIAGNOSIS — Z0000 Encounter for general adult medical examination without abnormal findings: Secondary | ICD-10-CM | POA: Diagnosis not present

## 2024-07-29 DIAGNOSIS — K219 Gastro-esophageal reflux disease without esophagitis: Secondary | ICD-10-CM | POA: Diagnosis not present

## 2024-07-29 DIAGNOSIS — Z23 Encounter for immunization: Secondary | ICD-10-CM | POA: Diagnosis not present

## 2024-08-20 DIAGNOSIS — Z3042 Encounter for surveillance of injectable contraceptive: Secondary | ICD-10-CM | POA: Diagnosis not present

## 2024-08-27 DIAGNOSIS — Z419 Encounter for procedure for purposes other than remedying health state, unspecified: Secondary | ICD-10-CM | POA: Diagnosis not present
